# Patient Record
Sex: Female | Born: 1955
Health system: Southern US, Community
[De-identification: ages and names within clinical notes are randomized; demographics above are authoritative.]

## PROBLEM LIST (undated history)

## (undated) DIAGNOSIS — Z91038 Other insect allergy status: Secondary | ICD-10-CM

## (undated) DIAGNOSIS — I7300 Raynaud's syndrome without gangrene: Secondary | ICD-10-CM

## (undated) DIAGNOSIS — Z91030 Bee allergy status: Secondary | ICD-10-CM

## (undated) DIAGNOSIS — C4491 Basal cell carcinoma of skin, unspecified: Secondary | ICD-10-CM

## (undated) DIAGNOSIS — R200 Anesthesia of skin: Secondary | ICD-10-CM

## (undated) DIAGNOSIS — M5030 Other cervical disc degeneration, unspecified cervical region: Secondary | ICD-10-CM

## (undated) DIAGNOSIS — G5601 Carpal tunnel syndrome, right upper limb: Secondary | ICD-10-CM

## (undated) DIAGNOSIS — K219 Gastro-esophageal reflux disease without esophagitis: Secondary | ICD-10-CM

## (undated) DIAGNOSIS — Z780 Asymptomatic menopausal state: Secondary | ICD-10-CM

## (undated) DIAGNOSIS — M1990 Unspecified osteoarthritis, unspecified site: Secondary | ICD-10-CM

## (undated) DIAGNOSIS — T7840XA Allergy, unspecified, initial encounter: Secondary | ICD-10-CM

## (undated) DIAGNOSIS — M5020 Other cervical disc displacement, unspecified cervical region: Secondary | ICD-10-CM

## (undated) HISTORY — DX: Basal cell carcinoma of skin, unspecified: C44.91

## (undated) HISTORY — DX: Other insect allergy status: Z91.038

## (undated) HISTORY — DX: Allergy, unspecified, initial encounter: T78.40XA

## (undated) HISTORY — DX: Carpal tunnel syndrome, right upper limb: G56.01

## (undated) HISTORY — DX: Raynaud's syndrome without gangrene: I73.00

## (undated) HISTORY — DX: Other cervical disc degeneration, unspecified cervical region: M50.30

## (undated) HISTORY — DX: Unspecified osteoarthritis, unspecified site: M19.90

## (undated) HISTORY — DX: Anesthesia of skin: R20.0

## (undated) HISTORY — DX: Gastro-esophageal reflux disease without esophagitis: K21.9

## (undated) HISTORY — DX: Bee allergy status: Z91.030

## (undated) HISTORY — DX: Asymptomatic menopausal state: Z78.0

## (undated) HISTORY — DX: Other cervical disc displacement, unspecified cervical region: M50.20

---

## 1995-12-02 HISTORY — PX: BREAST CYST ASPIRATION: SHX578

## 1999-10-02 ENCOUNTER — Encounter: Payer: Self-pay | Admitting: Obstetrics and Gynecology

## 1999-10-02 ENCOUNTER — Encounter: Admission: RE | Admit: 1999-10-02 | Discharge: 1999-10-02 | Payer: Self-pay | Admitting: Obstetrics and Gynecology

## 2001-02-03 ENCOUNTER — Encounter: Payer: Self-pay | Admitting: Obstetrics and Gynecology

## 2001-02-03 ENCOUNTER — Encounter: Admission: RE | Admit: 2001-02-03 | Discharge: 2001-02-03 | Payer: Self-pay | Admitting: Obstetrics and Gynecology

## 2002-08-10 ENCOUNTER — Encounter: Admission: RE | Admit: 2002-08-10 | Discharge: 2002-08-10 | Payer: Self-pay | Admitting: Obstetrics and Gynecology

## 2002-08-10 ENCOUNTER — Encounter: Payer: Self-pay | Admitting: Obstetrics and Gynecology

## 2003-11-01 ENCOUNTER — Ambulatory Visit (HOSPITAL_COMMUNITY): Admission: RE | Admit: 2003-11-01 | Discharge: 2003-11-01 | Payer: Self-pay | Admitting: Oral Surgery

## 2004-07-26 HISTORY — PX: BIOPSY ENDOMETRIAL: PRO11

## 2004-10-02 ENCOUNTER — Encounter: Admission: RE | Admit: 2004-10-02 | Discharge: 2004-10-02 | Payer: Self-pay | Admitting: Orthopaedic Surgery

## 2005-07-09 ENCOUNTER — Ambulatory Visit (HOSPITAL_COMMUNITY): Admission: RE | Admit: 2005-07-09 | Discharge: 2005-07-09 | Payer: Self-pay | Admitting: Internal Medicine

## 2006-07-26 DIAGNOSIS — C4491 Basal cell carcinoma of skin, unspecified: Secondary | ICD-10-CM

## 2006-07-26 HISTORY — DX: Basal cell carcinoma of skin, unspecified: C44.91

## 2006-12-02 ENCOUNTER — Other Ambulatory Visit: Admission: RE | Admit: 2006-12-02 | Discharge: 2006-12-02 | Payer: Self-pay | Admitting: Internal Medicine

## 2006-12-30 ENCOUNTER — Encounter: Admission: RE | Admit: 2006-12-30 | Discharge: 2006-12-30 | Payer: Self-pay | Admitting: Internal Medicine

## 2008-11-08 ENCOUNTER — Other Ambulatory Visit: Admission: RE | Admit: 2008-11-08 | Discharge: 2008-11-08 | Payer: Self-pay | Admitting: Internal Medicine

## 2008-11-08 ENCOUNTER — Ambulatory Visit: Payer: Self-pay | Admitting: Internal Medicine

## 2009-02-15 ENCOUNTER — Emergency Department (HOSPITAL_COMMUNITY): Admission: EM | Admit: 2009-02-15 | Discharge: 2009-02-15 | Payer: Self-pay | Admitting: Emergency Medicine

## 2009-12-12 ENCOUNTER — Ambulatory Visit: Payer: Self-pay | Admitting: Internal Medicine

## 2010-03-13 ENCOUNTER — Ambulatory Visit: Payer: Self-pay | Admitting: Internal Medicine

## 2010-10-01 ENCOUNTER — Other Ambulatory Visit: Payer: Self-pay | Admitting: Internal Medicine

## 2010-10-01 DIAGNOSIS — Z1231 Encounter for screening mammogram for malignant neoplasm of breast: Secondary | ICD-10-CM

## 2010-10-16 ENCOUNTER — Ambulatory Visit
Admission: RE | Admit: 2010-10-16 | Discharge: 2010-10-16 | Disposition: A | Discharge status: Home / Self Care | Payer: 59 | Source: Ambulatory Visit | Attending: Internal Medicine | Admitting: Internal Medicine

## 2010-10-16 DIAGNOSIS — Z1231 Encounter for screening mammogram for malignant neoplasm of breast: Secondary | ICD-10-CM

## 2010-11-01 LAB — POCT I-STAT, CHEM 8
BUN: 25 mg/dL — ABNORMAL HIGH (ref 6–23)
Creatinine, Ser: 1 mg/dL (ref 0.4–1.2)
Potassium: 3.9 mEq/L (ref 3.5–5.1)
Sodium: 132 mEq/L — ABNORMAL LOW (ref 135–145)
TCO2: 26 mmol/L (ref 0–100)

## 2010-11-01 LAB — GLUCOSE, CAPILLARY: Glucose-Capillary: 92 mg/dL (ref 70–99)

## 2011-01-08 ENCOUNTER — Other Ambulatory Visit: Payer: 59 | Admitting: Internal Medicine

## 2011-01-15 ENCOUNTER — Encounter: Payer: 59 | Admitting: Internal Medicine

## 2011-03-19 ENCOUNTER — Other Ambulatory Visit: Payer: 59 | Admitting: Internal Medicine

## 2011-03-19 DIAGNOSIS — Z0000 Encounter for general adult medical examination without abnormal findings: Secondary | ICD-10-CM

## 2011-03-19 LAB — CBC WITH DIFFERENTIAL/PLATELET
Eosinophils Absolute: 0 10*3/uL (ref 0.0–0.7)
Eosinophils Relative: 1 % (ref 0–5)
Hemoglobin: 13.7 g/dL (ref 12.0–15.0)
Lymphocytes Relative: 28 % (ref 12–46)
Lymphs Abs: 0.7 10*3/uL (ref 0.7–4.0)
MCH: 32.1 pg (ref 26.0–34.0)
MCV: 94.8 fL (ref 78.0–100.0)
Monocytes Relative: 6 % (ref 3–12)
Neutrophils Relative %: 64 % (ref 43–77)
Platelets: 230 10*3/uL (ref 150–400)
RBC: 4.27 MIL/uL (ref 3.87–5.11)
WBC: 2.6 10*3/uL — ABNORMAL LOW (ref 4.0–10.5)

## 2011-03-19 LAB — TSH: TSH: 0.879 u[IU]/mL (ref 0.350–4.500)

## 2011-03-20 LAB — COMPREHENSIVE METABOLIC PANEL
BUN: 16 mg/dL (ref 6–23)
CO2: 27 mEq/L (ref 19–32)
Calcium: 9.3 mg/dL (ref 8.4–10.5)
Chloride: 100 mEq/L (ref 96–112)
Creat: 0.77 mg/dL (ref 0.50–1.10)
Total Bilirubin: 0.4 mg/dL (ref 0.3–1.2)

## 2011-03-20 LAB — LIPID PANEL
HDL: 97 mg/dL (ref >39)
Total CHOL/HDL Ratio: 2.2 Ratio
Triglycerides: 53 mg/dL (ref <150)

## 2011-04-02 ENCOUNTER — Ambulatory Visit (INDEPENDENT_AMBULATORY_CARE_PROVIDER_SITE_OTHER): Payer: 59 | Admitting: Internal Medicine

## 2011-04-02 ENCOUNTER — Encounter: Payer: Self-pay | Admitting: Internal Medicine

## 2011-04-02 ENCOUNTER — Other Ambulatory Visit: Payer: Self-pay | Admitting: Internal Medicine

## 2011-04-02 ENCOUNTER — Other Ambulatory Visit (HOSPITAL_COMMUNITY)
Admission: RE | Admit: 2011-04-02 | Discharge: 2011-04-02 | Disposition: A | Discharge status: Home / Self Care | Payer: 59 | Source: Ambulatory Visit | Attending: Internal Medicine | Admitting: Internal Medicine

## 2011-04-02 VITALS — BP 124/84 | HR 68 | Temp 99.2°F | Ht 64.0 in | Wt 94.5 lb

## 2011-04-02 DIAGNOSIS — Z0000 Encounter for general adult medical examination without abnormal findings: Secondary | ICD-10-CM

## 2011-04-02 DIAGNOSIS — Z124 Encounter for screening for malignant neoplasm of cervix: Secondary | ICD-10-CM

## 2011-04-02 DIAGNOSIS — T7840XA Allergy, unspecified, initial encounter: Secondary | ICD-10-CM

## 2011-04-02 DIAGNOSIS — Z91038 Other insect allergy status: Secondary | ICD-10-CM

## 2011-04-02 DIAGNOSIS — Z23 Encounter for immunization: Secondary | ICD-10-CM

## 2011-04-02 DIAGNOSIS — Z01419 Encounter for gynecological examination (general) (routine) without abnormal findings: Secondary | ICD-10-CM | POA: Insufficient documentation

## 2011-04-02 DIAGNOSIS — IMO0001 Reserved for inherently not codable concepts without codable children: Secondary | ICD-10-CM

## 2011-04-02 DIAGNOSIS — D709 Neutropenia, unspecified: Secondary | ICD-10-CM

## 2011-04-02 DIAGNOSIS — M7918 Myalgia, other site: Secondary | ICD-10-CM

## 2011-04-02 LAB — POCT URINALYSIS DIPSTICK
Bilirubin, UA: NEGATIVE
Glucose, UA: NEGATIVE
Ketones, UA: NEGATIVE
Leukocytes, UA: NEGATIVE
Nitrite, UA: NEGATIVE
pH, UA: 7

## 2011-04-05 ENCOUNTER — Telehealth: Payer: Self-pay | Admitting: *Deleted

## 2011-04-05 MED ORDER — MELOXICAM 15 MG PO TABS: 15.0000 mg | ORAL_TABLET | Freq: Every day | ORAL | Status: DC

## 2011-04-05 NOTE — Telephone Encounter (Signed)
Please call in Mobic 15mg  #30 with 3 refills one tab p.o. Daily with a meal.

## 2011-04-05 NOTE — Telephone Encounter (Signed)
Wants Rx called in for Mobic.  States she spoke with you about this medication when she was here for her physical on Friday.

## 2011-04-12 ENCOUNTER — Telehealth: Payer: Self-pay | Admitting: *Deleted

## 2011-04-12 NOTE — Telephone Encounter (Signed)
Phone call report read and noted. MJB

## 2011-04-12 NOTE — Telephone Encounter (Signed)
Spoke with patient regarding appointment with Dr. Electa Sniff.  This MD no longer sees patients on Fridays and due to patient's schedule this is the only day she can make an OV.  She stated she did not want this particular appointment at this time and would call office when she changes her mind.  She does have an appointment with hematology on Friday September 28th at 1:30.

## 2011-04-20 ENCOUNTER — Encounter: Payer: 59 | Admitting: Oncology

## 2011-04-22 ENCOUNTER — Encounter: Payer: Self-pay | Admitting: Internal Medicine

## 2011-04-22 DIAGNOSIS — M7918 Myalgia, other site: Secondary | ICD-10-CM | POA: Insufficient documentation

## 2011-04-22 DIAGNOSIS — Z91038 Other insect allergy status: Secondary | ICD-10-CM | POA: Insufficient documentation

## 2011-04-22 NOTE — Progress Notes (Signed)
Subjective:    Patient ID: Tammy Shields, female    DOB: Jun 14, 1956, 55 y.o.   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 History of insect sting allergy July 2010. Patient exercises a great deal. She is very thin. Had torn labrum of left shoulder Dec 16, 2004. History of osteopenia. Recent fasting lab work showed a white blood cell count of 2600. Looking back over her blood counts over the past few years she appears to have a mild neutropenia. History of moderately elevated cholesterol in 12-16-09 with total cholesterol of 243 and LDL cholesterol of 121. By August 2011 she had normalized her cholesterol with diet and exercise. Her white blood cell count in 2011-05-24was 2900, in April 2010 it was 3900, in 05-24-2008it was 2898-12-16 and records from OB/GYN showed that in 12/16/05 white blood cell count was 3500. She takes calcium and vitamin D, multivitamin, vitamin C, fish old, be complex and some ibuprofen. 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 History of Raynaud's phenomenon. History of issues with sciatica since 1992/12/16. History of bruxism. Patient admits that sometimes she exercises in excess to control her weight. This dates back to early teen years. Realizes it's a form of control. Had amenorrhea for 2 years in Dec 17, 2003. Last menstrual period was 12-17-2006.  Father died at age 2 with prostate cancer, mother died at age 45 of cardiac arrest. Mother with history of diabetes and hypertension and cancer. One brother with diabetes. One sister in good health.  Patient did take Fosamax for a period of time but discontinued it.    Review of Systems  Constitutional: Negative.   HENT: Negative.   Eyes: Negative.   Respiratory: Negative.   Cardiovascular: Negative.   Gastrointestinal: Negative.   Genitourinary: Negative.   Musculoskeletal: Positive for back pain.       Issues with back pain, has had recent issues with foot pain  Neurological: Negative.     Hematological: Negative.   Psychiatric/Behavioral: Negative.        Objective:   Physical Exam  Vitals reviewed. Constitutional: She is oriented to person, place, and time. No distress.       Thin white female  HENT:  Head: Normocephalic and atraumatic.  Right Ear: External ear normal.  Left Ear: External ear normal.  Mouth/Throat: Oropharynx is clear and moist.  Eyes: Conjunctivae and EOM are normal. Pupils are equal, round, and reactive to light. No scleral icterus.  Neck: Neck supple. No thyromegaly present.  Cardiovascular: Normal rate, regular rhythm and normal heart sounds.   Pulmonary/Chest: Effort normal and breath sounds normal. She has no rales.       Breasts normal female  Abdominal: Soft. Bowel sounds are normal. She exhibits no mass. There is no tenderness.  Musculoskeletal: Normal range of motion. She exhibits no edema.  Lymphadenopathy:    She has no cervical adenopathy.  Neurological: She is alert and oriented to person, place, and time. She has normal reflexes. No cranial nerve deficit.  Skin: Skin is warm and dry. She is not diaphoretic.  Psychiatric: She has a normal mood and affect.          Assessment & Plan:  Long-standing history of neutropenia? Benign absolute neutropenia  Issues with anorexia  Musculoskeletal pain secondary to heavy exercise including back and foot pain  Health maintenance- patient needs screening colonoscopy  Will see hematology consultation regarding neutropenia  Consider referring her to Dr. Roanna Epley regarding  foot pain

## 2011-04-23 ENCOUNTER — Other Ambulatory Visit: Payer: Self-pay | Admitting: Oncology

## 2011-04-23 ENCOUNTER — Encounter (HOSPITAL_BASED_OUTPATIENT_CLINIC_OR_DEPARTMENT_OTHER): Payer: 59 | Admitting: Oncology

## 2011-04-23 DIAGNOSIS — D72819 Decreased white blood cell count, unspecified: Secondary | ICD-10-CM

## 2011-04-23 LAB — COMPREHENSIVE METABOLIC PANEL
AST: 31 U/L (ref 0–37)
Albumin: 4.9 g/dL (ref 3.5–5.2)
Alkaline Phosphatase: 66 U/L (ref 39–117)
Chloride: 101 mEq/L (ref 96–112)
Glucose, Bld: 90 mg/dL (ref 70–99)
Potassium: 4.4 mEq/L (ref 3.5–5.3)
Sodium: 141 mEq/L (ref 135–145)
Total Protein: 7 g/dL (ref 6.0–8.3)

## 2011-04-23 LAB — CBC WITH DIFFERENTIAL/PLATELET
Basophils Absolute: 0 10*3/uL (ref 0.0–0.1)
EOS%: 1.4 % (ref 0.0–7.0)
HGB: 13.3 g/dL (ref 11.6–15.9)
MCH: 33.4 pg (ref 25.1–34.0)
MONO#: 0.2 10*3/uL (ref 0.1–0.9)
NEUT#: 1.7 10*3/uL (ref 1.5–6.5)
RDW: 12.9 % (ref 11.2–14.5)
WBC: 2.9 10*3/uL — ABNORMAL LOW (ref 3.9–10.3)
lymph#: 0.9 10*3/uL (ref 0.9–3.3)

## 2011-07-23 ENCOUNTER — Telehealth: Payer: Self-pay | Admitting: Oncology

## 2011-07-23 NOTE — Telephone Encounter (Signed)
lm on machine for 09/03/11 appt,also mailed    aom appt per pof from 04/23/11

## 2011-09-03 ENCOUNTER — Encounter: Payer: Self-pay | Admitting: *Deleted

## 2011-09-03 ENCOUNTER — Ambulatory Visit (HOSPITAL_BASED_OUTPATIENT_CLINIC_OR_DEPARTMENT_OTHER): Payer: 59 | Admitting: Oncology

## 2011-09-03 ENCOUNTER — Other Ambulatory Visit (HOSPITAL_BASED_OUTPATIENT_CLINIC_OR_DEPARTMENT_OTHER): Payer: 59 | Admitting: Lab

## 2011-09-03 VITALS — BP 108/66 | HR 75 | Temp 98.6°F | Ht 64.0 in | Wt 100.8 lb

## 2011-09-03 DIAGNOSIS — D72819 Decreased white blood cell count, unspecified: Secondary | ICD-10-CM

## 2011-09-03 LAB — CBC WITH DIFFERENTIAL/PLATELET
BASO%: 0.9 % (ref 0.0–2.0)
LYMPH%: 32.6 % (ref 14.0–49.7)
MCHC: 35 g/dL (ref 31.5–36.0)
MCV: 93.6 fL (ref 79.5–101.0)
MONO%: 9.9 % (ref 0.0–14.0)
Platelets: 230 10*3/uL (ref 145–400)
RBC: 4.22 10*6/uL (ref 3.70–5.45)
WBC: 3.3 10*3/uL — ABNORMAL LOW (ref 3.9–10.3)

## 2011-09-03 NOTE — Progress Notes (Signed)
Hematology and Oncology Follow Up Visit  Tammy Shields [id number] 14-Sep-1955 56 y.o. 09/03/2011 9:59 AM    Principle Diagnosis: 56 year old woman with the Isolated leukocytopenia with a normal differential and normal hemoglobin and platelets. Likely of benign etiology. Possibilities include medication vs. Normal variations.    Interim History: Dr. Autumn Messing presents today for a follow up visit. She report no new symptoms since the last visit. She had not had any fevers, had not had any chills, had not had any weight loss.  Had not had really any major changes in her performance status or activity level.  She is still performing most activities of daily living without any hindrance or decline.  She has not had any abdominal pain.  Had not had any recurrent sinopulmonary infections.  Overall performance status and activity level have been unchanged.  Medications: I have reviewed the patient's current medications. Current outpatient prescriptions:b complex vitamins tablet, Take 1 tablet by mouth daily.  , Disp: , Rfl: ;  calcium carbonate (OS-CAL) 600 MG TABS, Take 600 mg by mouth 2 (two) times daily with a meal.  , Disp: , Rfl: ;  Cholecalciferol (VITAMIN D) 1000 UNITS capsule, Take 1,000 Units by mouth daily.  , Disp: , Rfl: ;  fish oil-omega-3 fatty acids 1000 MG capsule, Take 2 g by mouth daily.  , Disp: , Rfl:  ibuprofen (ADVIL,MOTRIN) 200 MG tablet, Take 200 mg by mouth. Hs prn , Disp: , Rfl: ;  multivitamin-iron-minerals-folic acid (CENTRUM) chewable tablet, Chew 1 tablet by mouth daily.  , Disp: , Rfl:   Allergies:  Allergies  Allergen Reactions  . Bee Venom Anaphylaxis    Past Medical History, Surgical history, Social history, and Family History were reviewed and updated.  Review of Systems: Constitutional:  Negative for fever, chills, night sweats, anorexia, weight loss, pain. Cardiovascular: no chest pain or dyspnea on exertion Respiratory: no cough, shortness of breath, or  wheezing Neurological: no TIA or stroke symptoms Dermatological: negative ENT: negative Skin: Negative. Gastrointestinal: no abdominal pain, change in bowel habits, or black or bloody stools Genito-Urinary: negative Hematological and Lymphatic: negative Breast: negative Musculoskeletal: negative Remaining ROS negative. Physical Exam: Blood pressure 108/66, pulse 75, temperature 98.6 F (37 C), temperature source Oral, height 5\' 4"  (1.626 m), weight 100 lb 12.8 oz (45.723 kg). ECOG: 0 General appearance: alert Head: Normocephalic, without obvious abnormality, atraumatic Neck: no adenopathy, no carotid bruit, no JVD, supple, symmetrical, trachea midline and thyroid not enlarged, symmetric, no tenderness/mass/nodules Lymph nodes: Cervical, supraclavicular, and axillary nodes normal. Heart:regular rate and rhythm, S1, S2 normal, no murmur, click, rub or gallop Lung:chest clear, no wheezing, rales, normal symmetric air entry Abdomin: soft, non-tender, without masses or organomegaly EXT:no erythema, induration, or nodules   Lab Results: Lab Results  Component Value Date   WBC 3.3* 09/03/2011   HGB 13.8 09/03/2011   HCT 39.5 09/03/2011   MCV 93.6 09/03/2011   PLT 230 09/03/2011     Chemistry      Component Value Date/Time   NA 141 04/23/2011 1024   K 4.4 04/23/2011 1024   CL 101 04/23/2011 1024   CO2 28 04/23/2011 1024   BUN 23 04/23/2011 1024   CREATININE 0.71 04/23/2011 1024   CREATININE 0.77 03/19/2011 1031      Component Value Date/Time   CALCIUM 9.6 04/23/2011 1024   ALKPHOS 66 04/23/2011 1024   AST 31 04/23/2011 1024   ALT 23 04/23/2011 1024   BILITOT 0.3 04/23/2011 1024  Impression and Plan: This is a pleasant 56 year old woman with Isolated leukocytopenia with a normal differential and normal hemoglobin and platelets. Her WBC is better today than last visit in 03/2011. The differential diagnosis at this time include benign variations, medications, autoimmune and less likely a  blood disorder.  Given the chronicity of this finding as well as the slight improvement in her counts, I favour a benign finding at this time. No further hematology work up is needed. I do recommend anunal cbc which can be done with her PCP. And if any new abnormality arises, they we will revaluate her then.  Follow up will be as needed.    [facility], MD 2/8/20139:59 AM

## 2012-12-01 ENCOUNTER — Other Ambulatory Visit: Payer: 59 | Admitting: Internal Medicine

## 2012-12-01 DIAGNOSIS — Z0000 Encounter for general adult medical examination without abnormal findings: Secondary | ICD-10-CM

## 2012-12-01 LAB — COMPREHENSIVE METABOLIC PANEL
ALT: 22 U/L (ref 0–35)
AST: 28 U/L (ref 0–37)
Calcium: 9.8 mg/dL (ref 8.4–10.5)
Chloride: 97 mEq/L (ref 96–112)
Creat: 0.87 mg/dL (ref 0.50–1.10)
Potassium: 4.5 mEq/L (ref 3.5–5.3)

## 2012-12-01 LAB — CBC WITH DIFFERENTIAL/PLATELET
Basophils Absolute: 0 10*3/uL (ref 0.0–0.1)
Lymphocytes Relative: 26 % (ref 12–46)
Neutro Abs: 3.1 10*3/uL (ref 1.7–7.7)
Platelets: 222 10*3/uL (ref 150–400)
RDW: 13.8 % (ref 11.5–15.5)
WBC: 4.7 10*3/uL (ref 4.0–10.5)

## 2012-12-01 LAB — LIPID PANEL: Total CHOL/HDL Ratio: 2.1 Ratio

## 2012-12-01 LAB — TSH: TSH: 0.65 u[IU]/mL (ref 0.350–4.500)

## 2012-12-02 LAB — VITAMIN D 25 HYDROXY (VIT D DEFICIENCY, FRACTURES): Vit D, 25-Hydroxy: 56 ng/mL (ref 30–89)

## 2012-12-08 ENCOUNTER — Encounter: Payer: Self-pay | Admitting: Internal Medicine

## 2012-12-08 ENCOUNTER — Ambulatory Visit (INDEPENDENT_AMBULATORY_CARE_PROVIDER_SITE_OTHER): Payer: 59 | Admitting: Internal Medicine

## 2012-12-08 ENCOUNTER — Other Ambulatory Visit (HOSPITAL_COMMUNITY)
Admission: RE | Admit: 2012-12-08 | Discharge: 2012-12-08 | Disposition: A | Discharge status: Home / Self Care | Payer: 59 | Source: Ambulatory Visit | Attending: Internal Medicine | Admitting: Internal Medicine

## 2012-12-08 VITALS — BP 116/80 | HR 68 | Temp 98.4°F | Ht 64.5 in | Wt 97.5 lb

## 2012-12-08 DIAGNOSIS — R0789 Other chest pain: Secondary | ICD-10-CM

## 2012-12-08 DIAGNOSIS — Z0000 Encounter for general adult medical examination without abnormal findings: Secondary | ICD-10-CM

## 2012-12-08 DIAGNOSIS — IMO0001 Reserved for inherently not codable concepts without codable children: Secondary | ICD-10-CM

## 2012-12-08 DIAGNOSIS — Z91038 Other insect allergy status: Secondary | ICD-10-CM

## 2012-12-08 DIAGNOSIS — Z01419 Encounter for gynecological examination (general) (routine) without abnormal findings: Secondary | ICD-10-CM | POA: Insufficient documentation

## 2012-12-08 DIAGNOSIS — M7918 Myalgia, other site: Secondary | ICD-10-CM

## 2012-12-08 DIAGNOSIS — Z862 Personal history of diseases of the blood and blood-forming organs and certain disorders involving the immune mechanism: Secondary | ICD-10-CM

## 2012-12-08 LAB — POCT URINALYSIS DIPSTICK
Blood, UA: NEGATIVE
Glucose, UA: NEGATIVE
Nitrite, UA: NEGATIVE
Protein, UA: NEGATIVE
Urobilinogen, UA: NEGATIVE
pH, UA: 6

## 2012-12-08 NOTE — Progress Notes (Signed)
Subjective:    Patient ID: Tammy Shields, female    DOB: 03-21-1956, 57 y.o.   MRN: [id number]  HPI  and 57 year old white female in today for health maintenance and evaluation of medical problems. On a recent trip to the [place] while scuba diving she felt some tightness in her chest wall under the water. This happened a few days in a row. No radiation of pain into her neck or down her left arm. Episodes only lasted about 10 minutes. She attributes it to anxiety. There has been some situational stress. She has allergy to insect stainings and carries an EpiPen. History of musculoskeletal pain. Having some pain with right MCP joint and right worry as. Right wrist pain started just after the scuba diving trip and there was some slight swelling of her wrist. It is getting better.  History of toward labral of left shoulder 2006. History of osteopenia. Took Fosamax for a while but discontinued it. Has not had bone density study in about 2 years so an order was given for her to have one. Has had low white blood cell count in the past which is being followed. White blood cell count in may 2011 was 2900, April 2010 was 3900, may 2008 was 2900. In 2007 white blood cell count was 3500. History of Raynaud's phenomenon. History of sciatica since 1994. History of bruxism. Patient admits that sometimes she exercises and access to control her weight and this dates back to her early teen years. Had amenorrhea for 2 years in 2005. Last menstrual period was in 2008.  Social history: She is married to an Transport planner. 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  Family history: Father died at age 32 with prostate cancer. Mother died at age 66 of cardiac arrest. Mother with history of diabetes, hypertension and cancer. One brother with diabetes. One sister in good health.  Has never had screening colonoscopy.    Review of Systems  Constitutional: Negative.   HENT: Negative.   Eyes: Negative.    Respiratory: Negative.   Cardiovascular: Positive for chest pain.  Gastrointestinal: Negative.   Endocrine: Negative.   Genitourinary: Negative.   Allergic/Immunologic:       History of insect sting allergy  Hematological:       History of neutropenia for many years  Psychiatric/Behavioral: Negative.        Objective:   Physical Exam  Vitals reviewed. Constitutional: She is oriented to person, place, and time. She appears well-developed and well-nourished. No distress.  HENT:  Head: Normocephalic and atraumatic.  Right Ear: External ear normal.  Left Ear: External ear normal.  Mouth/Throat: Oropharynx is clear and moist. No oropharyngeal exudate.  Eyes: Conjunctivae and EOM are normal. Pupils are equal, round, and reactive to light. Right eye exhibits no discharge. Left eye exhibits no discharge. No scleral icterus.  Neck: Normal range of motion. Neck supple. No JVD present. No thyromegaly present.  Cardiovascular: Normal rate, regular rhythm, normal heart sounds and intact distal pulses.   No murmur heard. Pulmonary/Chest: Effort normal and breath sounds normal. No respiratory distress. She has no wheezes. She has no rales. She exhibits no tenderness.  Breasts without masses  Abdominal: Soft. Bowel sounds are normal. She exhibits no distension and no mass. There is no tenderness. There is no rebound and no guarding.  Genitourinary: No vaginal discharge found.  Pap taken. Vaginal atrophy. Bimanual exam is normal. Rectovaginal confirms.  Musculoskeletal: Normal range of motion. She exhibits no  edema.  Lymphadenopathy:    She has no cervical adenopathy.  Neurological: She is alert and oriented to person, place, and time. She has normal reflexes. No cranial nerve deficit. Coordination normal.  Skin: Skin is warm and dry. No rash noted. She is not diaphoretic.  Psychiatric: She has a normal mood and affect. Her behavior is normal. Judgment and thought content normal.           Assessment & Plan:  Chest tightness on recent scuba diving trip-several episodes lasting short periods of time. Also had one similar episode while working on the patient recently. EKG is within normal limits. Patient had no diaphoresis or radiation of pain. Relates this to being anxious.  History of neutropenia which is long-standing over many years. White blood cell count recently 4700 which is the best reading she's had in some time  Elevated HDL cholesterol of 104  Musculoskeletal pain  Insect sting allergy  Probable tendinitis right wrist  Plan: Mentioned colonoscopy and she will consider it. Mammogram and bone density study. Order written for both. Return in one year or as needed.

## 2012-12-08 NOTE — Patient Instructions (Addendum)
Return in one year or as needed. Have mammogram and bone density study. Consider colonoscopy. Refill EpiPen when necessary one year if drugstore calls

## 2013-07-06 ENCOUNTER — Encounter: Payer: Self-pay | Admitting: Internal Medicine

## 2013-07-06 ENCOUNTER — Encounter: Payer: Self-pay | Admitting: Gastroenterology

## 2013-07-06 ENCOUNTER — Ambulatory Visit (INDEPENDENT_AMBULATORY_CARE_PROVIDER_SITE_OTHER): Payer: 59 | Admitting: Internal Medicine

## 2013-07-06 VITALS — BP 124/88 | HR 68 | Temp 98.5°F | Ht 65.0 in | Wt 99.0 lb

## 2013-07-06 DIAGNOSIS — M25559 Pain in unspecified hip: Secondary | ICD-10-CM

## 2013-07-06 DIAGNOSIS — K589 Irritable bowel syndrome without diarrhea: Secondary | ICD-10-CM

## 2013-07-06 DIAGNOSIS — M25551 Pain in right hip: Secondary | ICD-10-CM

## 2013-07-06 DIAGNOSIS — IMO0002 Reserved for concepts with insufficient information to code with codable children: Secondary | ICD-10-CM

## 2013-07-12 ENCOUNTER — Telehealth: Payer: Self-pay | Admitting: *Deleted

## 2013-07-12 NOTE — Telephone Encounter (Signed)
Prior auth # [id number] for mri w/o contrast

## 2013-07-15 ENCOUNTER — Ambulatory Visit
Admission: RE | Admit: 2013-07-15 | Discharge: 2013-07-15 | Disposition: A | Discharge status: Home / Self Care | Payer: 59 | Source: Ambulatory Visit | Attending: Internal Medicine | Admitting: Internal Medicine

## 2013-07-15 DIAGNOSIS — IMO0002 Reserved for concepts with insufficient information to code with codable children: Secondary | ICD-10-CM

## 2013-07-16 ENCOUNTER — Telehealth: Payer: Self-pay | Admitting: Internal Medicine

## 2013-07-16 NOTE — Telephone Encounter (Signed)
Dr. Beryle Quant note advised no significant disc or nerve root impingement.  Offered to refer patient to Orthopedic doc.  Patient will take the referral.   Appointment made for Dr. Norlene Campbell at [facility] @ [phone] for 07/27/13 @ 0930.  [address], [address].  Faxed notes/labs/imaging to [phone]. Patient notified and confirmed.

## 2013-07-18 ENCOUNTER — Encounter: Payer: Self-pay | Admitting: Gastroenterology

## 2013-07-18 ENCOUNTER — Ambulatory Visit (INDEPENDENT_AMBULATORY_CARE_PROVIDER_SITE_OTHER): Payer: 59 | Admitting: Gastroenterology

## 2013-07-18 VITALS — BP 110/70 | HR 80 | Ht 64.5 in | Wt 95.8 lb

## 2013-07-18 DIAGNOSIS — R198 Other specified symptoms and signs involving the digestive system and abdomen: Secondary | ICD-10-CM

## 2013-07-18 DIAGNOSIS — Z1211 Encounter for screening for malignant neoplasm of colon: Secondary | ICD-10-CM

## 2013-07-18 MED ORDER — PEG-KCL-NACL-NASULF-NA ASC-C 100 G PO SOLR: 1.0000 | Freq: Once | ORAL | Status: DC

## 2013-07-18 NOTE — Progress Notes (Signed)
    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 She relates frequent problems with hip pain bilaterally exacerbated by movement and exercise. She has noted problems with mild constipation which she attributes to calcium supplements. She takes magnesium oxide to counter the constipation symptoms which frequently leads to looser stools. These symptoms have been present for several years. Occasionally she notes a small hemorrhoid and straining with a bowel movement. She has had radiation of her bilateral hip pain for her sacral and rectal area. She has occasionally noted thinner stools. She has not previously undergone colonoscopy. Denies weight loss, abdominal pain, melena, hematochezia, nausea, vomiting, dysphagia, reflux symptoms, chest pain.  Review of Systems: Pertinent positive and negative review of systems were noted in the above HPI section. All other review of systems were otherwise negative.  Current Medications, Allergies, Past Medical History, Past Surgical History, Family History and Social History were reviewed in [employer] record.  Physical Exam: General: Well developed, thin, BMI=16, no acute distress Head: Normocephalic and atraumatic Eyes:  sclerae anicteric, EOMI Ears: Normal auditory acuity Mouth: No deformity or lesions Neck: Supple, no masses or thyromegaly Lungs: Clear throughout to auscultation Heart: Regular rate and rhythm; no murmurs, rubs or bruits Abdomen: Soft, non tender and non distended. No masses, hepatosplenomegaly or hernias noted. Normal Bowel sounds Rectal: Deferred to colonoscopy Musculoskeletal: Symmetrical with no gross deformities  Skin: No lesions on visible extremities Pulses:  Normal pulses noted Extremities: No clubbing, cyanosis, edema or deformities noted Neurological: Alert oriented x 4, grossly nonfocal Cervical Nodes:  No significant cervical adenopathy Inguinal Nodes: No significant inguinal  adenopathy Psychological:  Alert and cooperative. Normal mood and affect  Assessment and Recommendations:  1. Chronic mild constipation with frequent diarrhea. Occasional variation in stool caliber and occasional hemorrhoids symptoms. Schedule colonoscopy. The risks, benefits, and alternatives to colonoscopy with possible biopsy and possible polypectomy were discussed with the patient and they consent to proceed.

## 2013-07-18 NOTE — Patient Instructions (Signed)
You have been scheduled for a colonoscopy with propofol. Please follow written instructions given to you at your visit today.  Please pick up your prep kit at the pharmacy within the next 1-3 days. If you use inhalers (even only as needed), please bring them with you on the day of your procedure. Your physician has requested that you go to www.startemmi.com and enter the access code given to you at your visit today. This web site gives a general overview about your procedure. However, you should still follow specific instructions given to you by our office regarding your preparation for the procedure.  Thank you for choosing me and [facility].  Malcolm T. Stark, Jr., MD., FACG  

## 2013-07-23 ENCOUNTER — Encounter: Payer: Self-pay | Admitting: Gastroenterology

## 2013-09-07 ENCOUNTER — Ambulatory Visit (AMBULATORY_SURGERY_CENTER): Payer: 59 | Admitting: Gastroenterology

## 2013-09-07 ENCOUNTER — Encounter: Payer: Self-pay | Admitting: Gastroenterology

## 2013-09-07 VITALS — BP 113/71 | HR 50 | Temp 97.6°F | Resp 14 | Ht 64.0 in | Wt 95.0 lb

## 2013-09-07 DIAGNOSIS — D126 Benign neoplasm of colon, unspecified: Secondary | ICD-10-CM

## 2013-09-07 DIAGNOSIS — R198 Other specified symptoms and signs involving the digestive system and abdomen: Secondary | ICD-10-CM

## 2013-09-07 MED ORDER — SODIUM CHLORIDE 0.9 % IV SOLN: 500.0000 mL | INTRAVENOUS | Status: DC

## 2013-09-07 NOTE — Progress Notes (Signed)
Called to room to assist during endoscopic procedure.  Patient ID and intended procedure confirmed with present staff. Received instructions for my participation in the procedure from the performing physician.  

## 2013-09-07 NOTE — Op Note (Signed)
[facility]  [address] [place], [id number]   COLONOSCOPY PROCEDURE REPORT  PATIENT: Tammy Shields, Tammy Shields  MR#: [id number] BIRTHDATE: 29-Oct-1955 , 27  yrs. old GENDER: Female ENDOSCOPIST: Ladene Artist, MD, [facility] REFERRED FW:YOVZ Parke Simmers, M.D. PROCEDURE DATE:  09/07/2013 PROCEDURE:   Colonoscopy with biopsy First Screening Colonoscopy - Avg.  risk and is 50 yrs.  old or older - No.  Prior Negative Screening - Now for repeat screening. N/A  History of Adenoma - Now for follow-up colonoscopy & has been > or = to 3 yrs.  N/A  Polyps Removed Today? Yes. ASA CLASS:   Class II INDICATIONS:Change in bowel habits. MEDICATIONS: MAC sedation, administered by CRNA and propofol (Diprivan) 280mg  IV DESCRIPTION OF PROCEDURE:   After the risks benefits and alternatives of the procedure were thoroughly explained, informed consent was obtained.  A digital rectal exam revealed no abnormalities of the rectum.   The LB [id number]  endoscope was introduced through the anus and advanced to the cecum, which was identified by both the appendix and ileocecal valve. No adverse events experienced.   The quality of the prep was excellent, using MoviPrep  The instrument was then slowly withdrawn as the colon was fully examined.  COLON FINDINGS: A sessile polyp measuring 4 mm in size was found in the sigmoid colon.  A polypectomy was performed with cold forceps. The resection was complete and the polyp tissue was completely retrieved.   A sessile polyp measuring 5 mm in size was found in the rectum.  A polypectomy was performed with cold forceps.  The resection was complete and the polyp tissue was completely retrieved.   The colon was otherwise normal.  There was no diverticulosis, inflammation, polyps or cancers unless previously stated.  Retroflexed views revealed moderate internal hemorrhoids. The time to cecum=7 minutes 20 seconds.  Withdrawal time=13 minutes 42 seconds.  The  scope was withdrawn and the procedure completed.  COMPLICATIONS: There were no complications.  ENDOSCOPIC IMPRESSION: 1.   Sessile polyp measuring 4 mm in the sigmoid colon; polypectomy performed with cold forceps 2.   Sessile polyp measuring 5 mm in the rectum; polypectomy performed with cold forceps 3.   Moderate internal hemorrhoids  RECOMMENDATIONS: 1.  Await pathology results 2.  Repeat colonoscopy in 5 years if polyp(s) adenomatous; otherwise 10 years  eSigned:  Ladene Artist, MD, [facility] 09/07/2013 4:05 PM

## 2013-09-07 NOTE — Patient Instructions (Signed)
YOU HAD AN ENDOSCOPIC PROCEDURE TODAY AT THE [facility]: Refer to the procedure report that was given to you for any specific questions about what was found during the examination.  If the procedure report does not answer your questions, please call your gastroenterologist to clarify.  If you requested that your care partner not be given the details of your procedure findings, then the procedure report has been included in a sealed envelope for you to review at your convenience later.  YOU SHOULD EXPECT: Some feelings of bloating in the abdomen. Passage of more gas than usual.  Walking can help get rid of the air that was put into your GI tract during the procedure and reduce the bloating. If you had a lower endoscopy (such as a colonoscopy or flexible sigmoidoscopy) you may notice spotting of blood in your stool or on the toilet paper. If you underwent a bowel prep for your procedure, then you may not have a normal bowel movement for a few days.  DIET: Your first meal following the procedure should be a light meal and then it is ok to progress to your normal diet.  A half-sandwich or bowl of soup is an example of a good first meal.  Heavy or fried foods are harder to digest and may make you feel nauseous or bloated.  Likewise meals heavy in dairy and vegetables can cause extra gas to form and this can also increase the bloating.  Drink plenty of fluids but you should avoid alcoholic beverages for 24 hours.  ACTIVITY: Your care partner should take you home directly after the procedure.  You should plan to take it easy, moving slowly for the rest of the day.  You can resume normal activity the day after the procedure however you should NOT DRIVE or use heavy machinery for 24 hours (because of the sedation medicines used during the test).    SYMPTOMS TO REPORT IMMEDIATELY: A gastroenterologist can be reached at any hour.  During normal business hours, 8:30 AM to 5:00 PM Monday through Friday,  call [phone].  After hours and on weekends, please call the GI answering service at [phone] who will take a message and have the physician on call contact you.   Following lower endoscopy (colonoscopy or flexible sigmoidoscopy):  Excessive amounts of blood in the stool  Significant tenderness or worsening of abdominal pains  Swelling of the abdomen that is new, acute  Fever of 100F or higher  FOLLOW UP: If any biopsies were taken you will be contacted by phone or by letter within the next 1-3 weeks.  Call your gastroenterologist if you have not heard about the biopsies in 3 weeks.  Our staff will call the home number listed on your records the next business day following your procedure to check on you and address any questions or concerns that you may have at that time regarding the information given to you following your procedure. This is a courtesy call and so if there is no answer at the home number and we have not heard from you through the emergency physician on call, we will assume that you have returned to your regular daily activities without incident.  SIGNATURES/CONFIDENTIALITY: You and/or your care partner have signed paperwork which will be entered into your electronic medical record.  These signatures attest to the fact that that the information above on your After Visit Summary has been reviewed and is understood.  Full responsibility of the confidentiality of this   discharge information lies with you and/or your care-partner.  Polyps, hemorrhoids-handouts given  Repeat colonoscopy will be determined by pathology.

## 2013-09-10 ENCOUNTER — Telehealth: Payer: Self-pay | Admitting: *Deleted

## 2013-09-10 NOTE — Telephone Encounter (Signed)
Message left

## 2013-09-13 ENCOUNTER — Encounter: Payer: Self-pay | Admitting: Gastroenterology

## 2013-12-29 ENCOUNTER — Encounter: Payer: Self-pay | Admitting: Internal Medicine

## 2013-12-29 NOTE — Patient Instructions (Addendum)
Arrange for colonoscopy. MRI of the LS spine. Decrease amount of exercise for few weeks. Continue Motrin for pain

## 2013-12-29 NOTE — Progress Notes (Signed)
   Subjective:    Patient ID: Tammy Shields, female    DOB: [birth year], 58 y.o.   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 Has not had colonoscopy. Has been having some pain in right hip into leg and back. Does a lot of exercising. Has had some discomfort in rectal area as well. History of sciatica around 1994. History of low white blood cell count which is being followed regularly. History of Raynaud's phenomenon. No nausea, vomiting, appetite changes. No melena or bright red blood per rectum. No weakness in lower extremities.    Review of Systems     Objective:   Physical Exam  abdominal exam: Bowel sounds are active. No hepatosplenomegaly masses or tenderness. Rectal exam no mass appreciated. Straight leg raising is negative at 90. Deep tendon reflexes are 1-2+ and symmetrical in muscle strength appears to be normal. No significant pain with internal and external rotation of right hip.        Assessment & Plan:  Right lower extremity radiculopathy  Hip pain  Possible irritable bowel syndrome  Plan: Arrange GI consultation and colonoscopy. MRI of the LS-spine. Cut back on exercising for several weeks.  25 minutes spent with patient

## 2015-01-01 ENCOUNTER — Other Ambulatory Visit: Payer: Self-pay | Admitting: Internal Medicine

## 2015-01-01 DIAGNOSIS — Z1231 Encounter for screening mammogram for malignant neoplasm of breast: Secondary | ICD-10-CM

## 2015-01-01 DIAGNOSIS — M810 Age-related osteoporosis without current pathological fracture: Secondary | ICD-10-CM

## 2015-01-03 ENCOUNTER — Other Ambulatory Visit: Payer: 59 | Admitting: Internal Medicine

## 2015-01-03 DIAGNOSIS — Z13220 Encounter for screening for lipoid disorders: Secondary | ICD-10-CM

## 2015-01-03 DIAGNOSIS — Z0000 Encounter for general adult medical examination without abnormal findings: Secondary | ICD-10-CM

## 2015-01-03 DIAGNOSIS — Z130 Encounter for screening for diseases of the blood and blood-forming organs and certain disorders involving the immune mechanism: Secondary | ICD-10-CM

## 2015-01-03 DIAGNOSIS — Z1329 Encounter for screening for other suspected endocrine disorder: Secondary | ICD-10-CM

## 2015-01-03 DIAGNOSIS — Z1321 Encounter for screening for nutritional disorder: Secondary | ICD-10-CM

## 2015-01-03 DIAGNOSIS — Z131 Encounter for screening for diabetes mellitus: Secondary | ICD-10-CM

## 2015-01-03 LAB — CBC WITH DIFFERENTIAL/PLATELET
BASOS ABS: 0.1 10*3/uL (ref 0.0–0.1)
Basophils Relative: 2 % — ABNORMAL HIGH (ref 0–1)
Eosinophils Absolute: 0.1 10*3/uL (ref 0.0–0.7)
Eosinophils Relative: 2 % (ref 0–5)
HCT: 41.8 % (ref 36.0–46.0)
Hemoglobin: 14.1 g/dL (ref 12.0–15.0)
LYMPHS ABS: 0.9 10*3/uL (ref 0.7–4.0)
Lymphocytes Relative: 33 % (ref 12–46)
MCH: 31.8 pg (ref 26.0–34.0)
MCHC: 33.7 g/dL (ref 30.0–36.0)
MCV: 94.1 fL (ref 78.0–100.0)
MPV: 10.2 fL (ref 8.6–12.4)
Monocytes Absolute: 0.2 10*3/uL (ref 0.1–1.0)
Monocytes Relative: 7 % (ref 3–12)
Neutro Abs: 1.6 10*3/uL — ABNORMAL LOW (ref 1.7–7.7)
Neutrophils Relative %: 56 % (ref 43–77)
PLATELETS: 212 10*3/uL (ref 150–400)
RBC: 4.44 MIL/uL (ref 3.87–5.11)
RDW: 13.5 % (ref 11.5–15.5)
WBC: 2.8 10*3/uL — AB (ref 4.0–10.5)

## 2015-01-03 LAB — LIPID PANEL
Cholesterol: 232 mg/dL — ABNORMAL HIGH (ref 0–200)
HDL: 100 mg/dL (ref >=46)
LDL Cholesterol: 117 mg/dL — ABNORMAL HIGH (ref 0–99)
Total CHOL/HDL Ratio: 2.3 Ratio
Triglycerides: 74 mg/dL (ref <150)
VLDL: 15 mg/dL (ref 0–40)

## 2015-01-03 LAB — COMPLETE METABOLIC PANEL WITH GFR
ALBUMIN: 4.5 g/dL (ref 3.5–5.2)
ALK PHOS: 64 U/L (ref 39–117)
ALT: 25 U/L (ref 0–35)
AST: 32 U/L (ref 0–37)
BUN: 27 mg/dL — ABNORMAL HIGH (ref 6–23)
CHLORIDE: 102 meq/L (ref 96–112)
CO2: 27 mEq/L (ref 19–32)
CREATININE: 0.79 mg/dL (ref 0.50–1.10)
Calcium: 9.5 mg/dL (ref 8.4–10.5)
GFR, Est Non African American: 81 mL/min
GLUCOSE: 95 mg/dL (ref 70–99)
POTASSIUM: 5 meq/L (ref 3.5–5.3)
SODIUM: 143 meq/L (ref 135–145)
Total Bilirubin: 0.3 mg/dL (ref 0.2–1.2)
Total Protein: 6.6 g/dL (ref 6.0–8.3)

## 2015-01-03 LAB — TSH: TSH: 0.788 u[IU]/mL (ref 0.350–4.500)

## 2015-01-03 NOTE — Addendum Note (Signed)
Addended by: Leota Jacobsen on: 01/03/2015 09:50 AM   Modules accepted: Orders

## 2015-01-04 LAB — VITAMIN D 25 HYDROXY (VIT D DEFICIENCY, FRACTURES): Vit D, 25-Hydroxy: 42 ng/mL (ref 30–100)

## 2015-01-04 LAB — HEMOGLOBIN A1C
Hgb A1c MFr Bld: 5.7 % — ABNORMAL HIGH (ref <5.7)
Mean Plasma Glucose: 117 mg/dL — ABNORMAL HIGH (ref <117)

## 2015-01-10 ENCOUNTER — Encounter: Payer: Self-pay | Admitting: Internal Medicine

## 2015-01-10 ENCOUNTER — Ambulatory Visit
Admission: RE | Admit: 2015-01-10 | Discharge: 2015-01-10 | Disposition: A | Discharge status: Home / Self Care | Payer: 59 | Source: Ambulatory Visit | Attending: Internal Medicine | Admitting: Internal Medicine

## 2015-01-10 DIAGNOSIS — Z1231 Encounter for screening mammogram for malignant neoplasm of breast: Secondary | ICD-10-CM

## 2015-01-10 DIAGNOSIS — M810 Age-related osteoporosis without current pathological fracture: Secondary | ICD-10-CM

## 2015-02-07 ENCOUNTER — Ambulatory Visit (INDEPENDENT_AMBULATORY_CARE_PROVIDER_SITE_OTHER): Payer: 59 | Admitting: Internal Medicine

## 2015-02-07 ENCOUNTER — Encounter: Payer: Self-pay | Admitting: Internal Medicine

## 2015-02-07 VITALS — BP 100/66 | HR 56 | Temp 98.1°F | Ht 64.0 in | Wt 96.0 lb

## 2015-02-07 DIAGNOSIS — Z91038 Other insect allergy status: Secondary | ICD-10-CM | POA: Diagnosis not present

## 2015-02-07 DIAGNOSIS — Z862 Personal history of diseases of the blood and blood-forming organs and certain disorders involving the immune mechanism: Secondary | ICD-10-CM | POA: Diagnosis not present

## 2015-02-07 DIAGNOSIS — R0989 Other specified symptoms and signs involving the circulatory and respiratory systems: Secondary | ICD-10-CM

## 2015-02-07 DIAGNOSIS — Z0000 Encounter for general adult medical examination without abnormal findings: Secondary | ICD-10-CM | POA: Diagnosis not present

## 2015-02-07 DIAGNOSIS — E7889 Other lipoprotein metabolism disorders: Secondary | ICD-10-CM

## 2015-02-07 DIAGNOSIS — M7918 Myalgia, other site: Secondary | ICD-10-CM

## 2015-02-07 DIAGNOSIS — M791 Myalgia: Secondary | ICD-10-CM | POA: Diagnosis not present

## 2015-02-07 DIAGNOSIS — K635 Polyp of colon: Secondary | ICD-10-CM | POA: Diagnosis not present

## 2015-02-07 LAB — POCT URINALYSIS DIPSTICK
BILIRUBIN UA: NEGATIVE
Blood, UA: NEGATIVE
GLUCOSE UA: NEGATIVE
KETONES UA: NEGATIVE
Leukocytes, UA: NEGATIVE
Nitrite, UA: NEGATIVE
PH UA: 7.5
Protein, UA: NEGATIVE
Spec Grav, UA: <=1.005
Urobilinogen, UA: NEGATIVE

## 2015-04-05 DIAGNOSIS — T63481A Toxic effect of venom of other arthropod, accidental (unintentional), initial encounter: Secondary | ICD-10-CM | POA: Insufficient documentation

## 2015-04-05 DIAGNOSIS — T782XXA Anaphylactic shock, unspecified, initial encounter: Secondary | ICD-10-CM

## 2015-04-23 ENCOUNTER — Encounter: Payer: Self-pay | Admitting: Internal Medicine

## 2015-04-23 NOTE — Progress Notes (Signed)
   Subjective:    Patient ID: Tammy Shields, female    DOB: 09-05-1955, 59 y.o.   MRN: [id number]  HPI 59 year old white female in today for health maintenance exam and evaluation of medical issues. She'll and weighs 96 pounds. She exercises frequently. History of allergy 1016's and carries an EpiPen. History of musculoskeletal pain. History of labral tear left shoulder 2006. Osteopenia. She took Fosamax for a while but discontinued it. History of low white blood cell count in the past which is followed. White blood cell count in May 2011 was 2900, April 2010 was 3900, May 2008 was 2900, in 2007 it was 3500. History of Raynaud's phenomenon. History of sciatica. History of bruxism. History of constipation. Had amenorrhea for 2 years in 2005. Last menstrual period was in 2008.  Social history: She is married to a retired Chief Financial Officer. 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  Family history: Father died at age 85 with prostate cancer. Mother died at age 50 of cardiac arrest. Mother with history of diabetes, hypertension, cancer. One brother with diabetes. One sister in good health.    Review of Systems  Constitutional: Negative.   All other systems reviewed and are negative.      Objective:   Physical Exam  Constitutional: She is oriented to person, place, and time. She appears well-developed and well-nourished. No distress.  HENT:  Head: Normocephalic and atraumatic.  Right Ear: External ear normal.  Left Ear: External ear normal.  Mouth/Throat: Oropharynx is clear and moist. No oropharyngeal exudate.  Eyes: Conjunctivae and EOM are normal. Pupils are equal, round, and reactive to light. Right eye exhibits no discharge. Left eye exhibits no discharge. No scleral icterus.  Neck: Neck supple. No JVD present. No thyromegaly present.  Cardiovascular: Normal rate, regular rhythm and normal heart sounds.   No murmur heard. Pulmonary/Chest: Effort normal and breath sounds normal.  No respiratory distress. She has no wheezes. She has no rales.  Breasts normal female without masses  Abdominal: Soft. Bowel sounds are normal. She exhibits no distension and no mass. There is no rebound and no guarding.  Genitourinary:  Pap taken 2014. Bimanual normal.  Musculoskeletal: She exhibits no edema.  Lymphadenopathy:    She has no cervical adenopathy.  Neurological: She is alert and oriented to person, place, and time. She has normal reflexes. No cranial nerve deficit. Coordination normal.  Skin: Skin is warm and dry. No rash noted. She is not diaphoretic.  Psychiatric: She has a normal mood and affect. Her behavior is normal. Judgment and thought content normal.  Vitals reviewed.         Assessment & Plan:  Normal health maintenance exam  History of musculoskeletal pain  History of elevated HDL cholesterol-LDL cholesterol 117  Insect sting allergy allergy  History of neutropenia which is followed-to have repeat white blood cell count in October with peripheral view of smear.  Hemoglobin A 1C at 5.7%-watch diet  History of hyperplastic colon polyp on colonoscopy 2015  Plan: Return in October to follow-up on neutropenia.

## 2015-04-23 NOTE — Patient Instructions (Signed)
Patient will return in October for follow-up of low white blood cell count with peripheral view of smear to be done

## 2015-05-02 ENCOUNTER — Other Ambulatory Visit: Payer: 59 | Admitting: Internal Medicine

## 2015-05-02 DIAGNOSIS — D709 Neutropenia, unspecified: Secondary | ICD-10-CM

## 2015-05-02 LAB — CBC WITH DIFFERENTIAL/PLATELET
BASOS PCT: 1 % (ref 0–1)
Basophils Absolute: 0 10*3/uL (ref 0.0–0.1)
Eosinophils Absolute: 0.1 10*3/uL (ref 0.0–0.7)
Eosinophils Relative: 2 % (ref 0–5)
HEMATOCRIT: 38.4 % (ref 36.0–46.0)
HEMOGLOBIN: 13.5 g/dL (ref 12.0–15.0)
LYMPHS ABS: 1 10*3/uL (ref 0.7–4.0)
LYMPHS PCT: 35 % (ref 12–46)
MCH: 32.3 pg (ref 26.0–34.0)
MCHC: 35.2 g/dL (ref 30.0–36.0)
MCV: 91.9 fL (ref 78.0–100.0)
MONO ABS: 0.3 10*3/uL (ref 0.1–1.0)
MONOS PCT: 9 % (ref 3–12)
MPV: 10.3 fL (ref 8.6–12.4)
NEUTROS ABS: 1.5 10*3/uL — AB (ref 1.7–7.7)
NEUTROS PCT: 53 % (ref 43–77)
Platelets: 215 10*3/uL (ref 150–400)
RBC: 4.18 MIL/uL (ref 3.87–5.11)
RDW: 13.6 % (ref 11.5–15.5)
WBC: 2.9 10*3/uL — ABNORMAL LOW (ref 4.0–10.5)

## 2015-05-02 NOTE — Addendum Note (Signed)
Addended by: Leota Jacobsen on: 05/02/2015 09:21 AM   Modules accepted: Orders

## 2015-05-06 LAB — PATHOLOGIST SMEAR REVIEW

## 2015-05-07 ENCOUNTER — Telehealth: Payer: Self-pay | Admitting: *Deleted

## 2015-05-07 NOTE — Telephone Encounter (Signed)
Left message for Tammy Shields at [facility] to call me back to schedule patient for an appt. Patient needs a Friday appt and can be contacted on her cell phone # [phone]

## 2015-05-09 ENCOUNTER — Ambulatory Visit: Payer: 59 | Admitting: Internal Medicine

## 2015-05-13 ENCOUNTER — Ambulatory Visit (INDEPENDENT_AMBULATORY_CARE_PROVIDER_SITE_OTHER): Payer: 59 | Admitting: *Deleted

## 2015-05-13 DIAGNOSIS — T63441D Toxic effect of venom of bees, accidental (unintentional), subsequent encounter: Secondary | ICD-10-CM

## 2015-05-16 ENCOUNTER — Ambulatory Visit: Payer: 59 | Admitting: Internal Medicine

## 2015-05-27 ENCOUNTER — Telehealth: Payer: Self-pay

## 2015-05-27 NOTE — Telephone Encounter (Signed)
Patient called requesting copy of recent cbc results be mailed to her.  Confirmed address and printed labs to be mailed.  Patient aware.

## 2015-06-24 ENCOUNTER — Ambulatory Visit (INDEPENDENT_AMBULATORY_CARE_PROVIDER_SITE_OTHER): Payer: 59

## 2015-06-24 DIAGNOSIS — T63441D Toxic effect of venom of bees, accidental (unintentional), subsequent encounter: Secondary | ICD-10-CM

## 2015-07-29 ENCOUNTER — Ambulatory Visit (INDEPENDENT_AMBULATORY_CARE_PROVIDER_SITE_OTHER): Payer: 59

## 2015-07-29 DIAGNOSIS — T63441D Toxic effect of venom of bees, accidental (unintentional), subsequent encounter: Secondary | ICD-10-CM

## 2015-08-05 ENCOUNTER — Ambulatory Visit (INDEPENDENT_AMBULATORY_CARE_PROVIDER_SITE_OTHER): Payer: 59 | Admitting: Allergy and Immunology

## 2015-08-05 ENCOUNTER — Encounter: Payer: Self-pay | Admitting: Allergy and Immunology

## 2015-08-05 VITALS — BP 128/80 | HR 60 | Resp 18 | Ht 63.39 in | Wt 96.6 lb

## 2015-08-05 DIAGNOSIS — T63481D Toxic effect of venom of other arthropod, accidental (unintentional), subsequent encounter: Secondary | ICD-10-CM

## 2015-08-05 DIAGNOSIS — Z91038 Other insect allergy status: Secondary | ICD-10-CM

## 2015-08-05 DIAGNOSIS — T782XXD Anaphylactic shock, unspecified, subsequent encounter: Secondary | ICD-10-CM

## 2015-08-05 MED ORDER — EPINEPHRINE 0.3 MG/0.3ML IJ SOAJ: INTRAMUSCULAR | Status: DC

## 2015-08-05 NOTE — Patient Instructions (Signed)
  1.  Continue immunotherapy (and EpiPen) ° °2.  Return to clinic in 1 year or earlier if problem °

## 2015-08-05 NOTE — Progress Notes (Signed)
[facility]  Follow-up Note  Referring Provider: Elby Showers, MD Primary Provider: Elby Showers, MD Date of Office Visit: 08/05/2015  Subjective:   Tammy Shields is a 60 y.o. female who returns to the [facility] in re-evaluation of the following:  HPI Comments:  Tammy Shields returns to this clinic on 08/05/2015 in reevaluation of her Hymenoptera venom hypersensitivity state treated with immunotherapy. Presently she's using immunotherapy about every 6 weeks without any incident. She is not had a field staining. She does have an EpiPen.   Current Outpatient Prescriptions on File Prior to Visit  Medication Sig Dispense Refill  . calcium carbonate (OS-CAL) 600 MG TABS Take 600 mg by mouth 2 (two) times daily with a meal.      . Calcium-Phosphorus-Vitamin D (CITRACAL CALCIUM GUMMIES) [id number] MG-MG-UNIT CHEW Chew 1 Units by mouth daily.    . Cholecalciferol (VITAMIN D) 1000 UNITS capsule Take 2,000 Units by mouth daily.     Marland Kitchen ibuprofen (ADVIL,MOTRIN) 200 MG tablet Take 200 mg by mouth. Hs prn     . magnesium oxide (MAG-OX) 400 MG tablet Take 400 mg by mouth daily.    . Multiple Vitamins-Minerals (CENTRUM SILVER ADULT 50+) TABS Take 1 tablet by mouth daily.    . vitamin B-12 (CYANOCOBALAMIN) 100 MCG tablet Take 150 mcg by mouth daily.    . vitamin C (ASCORBIC ACID) 500 MG tablet Take 500 mg by mouth daily.     No current facility-administered medications on file prior to visit.    Meds ordered this encounter  Medications  . EPINEPHrine 0.3 mg/0.3 mL IJ SOAJ injection    Sig: Use as directed for life-threatening allergic reaction.    Dispense:  2 Device    Refill:  3    Patient has EpiPen coupon.. If EpiPen is not covered, can use generic as substitute.  Thank you- [place]    Past Medical History  Diagnosis Date  . Osteoporosis   . Postmenopausal   . Raynaud's disease     History reviewed. No pertinent  past surgical history.  Allergies  Allergen Reactions  . Bee Venom Anaphylaxis    MIXED VESPID, HONEY BEE    Review of systems negative except as noted in HPI / PMHx or noted below:  Review of Systems  Constitutional: Negative.   HENT: Negative.   Eyes: Negative.   Respiratory: Negative.   Cardiovascular: Negative.   Gastrointestinal: Negative.   Genitourinary: Negative.   Musculoskeletal: Negative.        Untreated osteoporosis  Skin: Negative.   Neurological: Negative.   Endo/Heme/Allergies: Negative.   Psychiatric/Behavioral: Negative.      Objective:   Filed Vitals:   08/05/15 1751  BP: 128/80  Pulse: 60  Resp: 18   Height: 5' 3.39" (161 cm)  Weight: 96 lb 9 oz (43.8 kg)   Physical Exam  Constitutional: She is well-developed, well-nourished, and in no distress. No distress.  HENT:  Head: Normocephalic.  Right Ear: Tympanic membrane, external ear and ear canal normal.  Left Ear: Tympanic membrane, external ear and ear canal normal.  Nose: Nose normal. No mucosal edema or rhinorrhea.  Mouth/Throat: Uvula is midline, oropharynx is clear and moist and mucous membranes are normal. No oropharyngeal exudate.  Eyes: Conjunctivae are normal.  Neck: Trachea normal. No tracheal tenderness present. No tracheal deviation present. No thyromegaly present.  Cardiovascular: Normal rate, regular rhythm, S1 normal, S2 normal and normal heart sounds.  No murmur heard. Pulmonary/Chest: Breath sounds normal. No stridor. No respiratory distress. She has no wheezes. She has no rales.  Musculoskeletal: She exhibits no edema.  Lymphadenopathy:       Head (right side): No tonsillar adenopathy present.       Head (left side): No tonsillar adenopathy present.    She has no cervical adenopathy.    She has no axillary adenopathy.  Neurological: She is alert. Gait normal.  Skin: No rash noted. She is not diaphoretic. No erythema. Nails show no clubbing.  Psychiatric: Mood and affect  normal.    Diagnostics: None  Assessment and Plan:   1. Allergic to insect stings   2. Anaphylaxis due to hymenoptera venom, accidental or unintentional, subsequent encounter      1. Continue immunotherapy and EpiPen  2. Return to clinic in 1 year or earlier if problem  We will continue to have Tammy Shields utilize immunotherapy directed against Hymenoptera venom. She can go out to every 8 weeks at this point in time. Given her extent of initial reaction she does have the option of getting re-skin tested to see if her skin tests go to negative as a result of her immunotherapy and then discontinuing her immunotherapy or she has the option of continuing on immunotherapy every 8 weeks indefinitely. She's presently considering these options.  Tammy Katz, MD [place]

## 2015-09-23 ENCOUNTER — Ambulatory Visit (INDEPENDENT_AMBULATORY_CARE_PROVIDER_SITE_OTHER): Payer: 59

## 2015-09-23 DIAGNOSIS — T63441D Toxic effect of venom of bees, accidental (unintentional), subsequent encounter: Secondary | ICD-10-CM

## 2015-11-13 ENCOUNTER — Ambulatory Visit (INDEPENDENT_AMBULATORY_CARE_PROVIDER_SITE_OTHER): Payer: 59

## 2015-11-13 DIAGNOSIS — T63441D Toxic effect of venom of bees, accidental (unintentional), subsequent encounter: Secondary | ICD-10-CM | POA: Diagnosis not present

## 2016-01-06 ENCOUNTER — Ambulatory Visit (INDEPENDENT_AMBULATORY_CARE_PROVIDER_SITE_OTHER): Payer: 59 | Admitting: *Deleted

## 2016-01-06 DIAGNOSIS — T63441D Toxic effect of venom of bees, accidental (unintentional), subsequent encounter: Secondary | ICD-10-CM

## 2016-01-09 ENCOUNTER — Ambulatory Visit (INDEPENDENT_AMBULATORY_CARE_PROVIDER_SITE_OTHER): Payer: 59 | Admitting: Family Medicine

## 2016-01-09 ENCOUNTER — Encounter: Payer: Self-pay | Admitting: Family Medicine

## 2016-01-09 VITALS — BP 126/84 | Ht 65.0 in | Wt 95.0 lb

## 2016-01-09 DIAGNOSIS — M7742 Metatarsalgia, left foot: Secondary | ICD-10-CM

## 2016-01-09 DIAGNOSIS — R269 Unspecified abnormalities of gait and mobility: Secondary | ICD-10-CM | POA: Diagnosis not present

## 2016-01-09 DIAGNOSIS — M7741 Metatarsalgia, right foot: Secondary | ICD-10-CM

## 2016-01-09 NOTE — Progress Notes (Signed)
Tammy Shields - 60 y.o. female MRN [id number]  Date of birth: 06-08-1956  CC: Orthotics  SUBJECTIVE:   HPI  Tammy Shields is a very pleasant 60 year old female who presents for orthotic construction at the request of Dr. Layne Benton. She's been having some forefoot pain for several months. She recently had an x-ray which was negative for a stress fracture. She has both dorsal and plantar pain, most notably on the left lateral forefoot. She's had orthotics previously made by [place] over 7 years ago which have lasted quite well. She did have metatarsal cookies added recently which seems to have helped her forefoot pain.   ROS:     As above no fevers chills or night sweats. No rash or joint swelling.  HISTORY: Past Medical, Surgical, Social, and Family History Reviewed & Updated per EMR.  Pertinent Historical Findings include: Osteoporosis, dentist  OBJECTIVE: BP 126/84 mmHg  Ht 5\' 5"  (1.651 m)  Wt 95 lb (43.092 kg)  BMI 15.81 kg/m2  Physical Exam Calm, no acute distress Nonlabored breathing  On left foot patient has hallux valgus deformity of great toe and first MT: there is associated loss of longitudinal arch and pes planus: Loss of transverse arch with breakdown of MTs and subluxation through the capsule ; small bunionette present. Slightly tender to palpation over the distal fourth metatarsal dorsally as well as plantarly. No bruising or swelling present.  On right foot patient has hallux valgus deformity of great toe and first MT; there is associated loss of longitudinal arch and pes planus; Loss of transverse arch with breakdown of MTs and subluxation through the capsule; small bunionette present.  Patient was fitted for: 2 pairs of standard, cushioned, semi-rigid orthotic. The orthotic was heated and afterward the patient stood on the orthotic blank positioned on the orthotic stand. The patient was positioned in subtalar neutral position and 10 degrees of ankle dorsiflexion in a weight bearing  stance. After completion of molding, a stable base was applied to the orthotic blank. The blank was ground to a stable position for weight bearing. Size:6 Base: Blue EVA Posting: Metatarsal cookies bilaterally Additional orthotic padding: None  MEDICATIONS, LABS & OTHER ORDERS: Previous Medications   CALCIUM CARBONATE (OS-CAL) 600 MG TABS    Take 600 mg by mouth 2 (two) times daily with a meal.     CALCIUM-PHOSPHORUS-VITAMIN D (CITRACAL CALCIUM GUMMIES) 250-115-250 MG-MG-UNIT CHEW    Chew 1 Units by mouth daily.   CHOLECALCIFEROL (VITAMIN D) 1000 UNITS CAPSULE    Take 2,000 Units by mouth daily.    EPINEPHRINE 0.3 MG/0.3 ML IJ SOAJ INJECTION    Use as directed for life-threatening allergic reaction.   IBUPROFEN (ADVIL,MOTRIN) 200 MG TABLET    Take 200 mg by mouth. Hs prn    MAGNESIUM OXIDE (MAG-OX) 400 MG TABLET    Take 400 mg by mouth daily.   MULTIPLE VITAMINS-MINERALS (CENTRUM SILVER ADULT 50+) TABS    Take 1 tablet by mouth daily.   VITAMIN B-12 (CYANOCOBALAMIN) 100 MCG TABLET    Take 150 mcg by mouth daily.   VITAMIN C (ASCORBIC ACID) 500 MG TABLET    Take 500 mg by mouth daily.   Modified Medications   No medications on file   New Prescriptions   No medications on file   Discontinued Medications   No medications on file  No orders of the defined types were placed in this encounter.   ASSESSMENT & PLAN: Morton's changes of the foot with metatarsalgia: 2 pairs  of orthotics constructed as above. Metatarsal cookies added in both pairs of orthotics. We hope these help her. She reported good relief upon putting these on. We did give her a metatarsal bar to try out as well. We did discuss the possibility of a stress reaction considering her dorsal pain. She'll watch this closely. She will follow-up with Dr. Layne Benton as needed. Call with any questions or concerns regarding her orthotics.  60 minutes were spent constructing and adjusting her orthotics. More than half this time was  face-to-face.

## 2016-02-06 ENCOUNTER — Other Ambulatory Visit: Payer: 59 | Admitting: Internal Medicine

## 2016-02-06 DIAGNOSIS — E7889 Other lipoprotein metabolism disorders: Secondary | ICD-10-CM

## 2016-02-06 DIAGNOSIS — Z1329 Encounter for screening for other suspected endocrine disorder: Secondary | ICD-10-CM

## 2016-02-06 DIAGNOSIS — Z0000 Encounter for general adult medical examination without abnormal findings: Secondary | ICD-10-CM

## 2016-02-06 DIAGNOSIS — Z1321 Encounter for screening for nutritional disorder: Secondary | ICD-10-CM

## 2016-02-06 DIAGNOSIS — D708 Other neutropenia: Secondary | ICD-10-CM

## 2016-02-06 LAB — COMPLETE METABOLIC PANEL WITH GFR
ALT: 21 U/L (ref 6–29)
AST: 26 U/L (ref 10–35)
Albumin: 4.4 g/dL (ref 3.6–5.1)
Alkaline Phosphatase: 48 U/L (ref 33–130)
BILIRUBIN TOTAL: 0.3 mg/dL (ref 0.2–1.2)
BUN: 29 mg/dL — ABNORMAL HIGH (ref 7–25)
CHLORIDE: 104 mmol/L (ref 98–110)
CO2: 27 mmol/L (ref 20–31)
Calcium: 9.5 mg/dL (ref 8.6–10.4)
Creat: 0.78 mg/dL (ref 0.50–0.99)
GFR, EST NON AFRICAN AMERICAN: 82 mL/min (ref >=60)
GFR, Est African American: >89 mL/min (ref >=60)
GLUCOSE: 96 mg/dL (ref 65–99)
POTASSIUM: 4.8 mmol/L (ref 3.5–5.3)
SODIUM: 142 mmol/L (ref 135–146)
TOTAL PROTEIN: 6.8 g/dL (ref 6.1–8.1)

## 2016-02-06 LAB — LIPID PANEL
CHOL/HDL RATIO: 2.1 ratio (ref <=5.0)
Cholesterol: 230 mg/dL — ABNORMAL HIGH (ref 125–200)
HDL: 108 mg/dL (ref >=46)
LDL CALC: 111 mg/dL (ref <130)
TRIGLYCERIDES: 55 mg/dL (ref <150)
VLDL: 11 mg/dL (ref <30)

## 2016-02-06 LAB — TSH: TSH: 0.8 m[IU]/L

## 2016-02-07 LAB — VITAMIN D 25 HYDROXY (VIT D DEFICIENCY, FRACTURES): Vit D, 25-Hydroxy: 64 ng/mL (ref 30–100)

## 2016-02-13 ENCOUNTER — Encounter: Payer: Self-pay | Admitting: Internal Medicine

## 2016-02-13 ENCOUNTER — Ambulatory Visit (INDEPENDENT_AMBULATORY_CARE_PROVIDER_SITE_OTHER): Payer: 59 | Admitting: Internal Medicine

## 2016-02-13 VITALS — BP 130/80 | HR 74 | Temp 98.6°F | Resp 18 | Ht 65.0 in | Wt 99.0 lb

## 2016-02-13 DIAGNOSIS — Z658 Other specified problems related to psychosocial circumstances: Secondary | ICD-10-CM

## 2016-02-13 DIAGNOSIS — Z91038 Other insect allergy status: Secondary | ICD-10-CM | POA: Diagnosis not present

## 2016-02-13 DIAGNOSIS — Z862 Personal history of diseases of the blood and blood-forming organs and certain disorders involving the immune mechanism: Secondary | ICD-10-CM

## 2016-02-13 DIAGNOSIS — Z0000 Encounter for general adult medical examination without abnormal findings: Secondary | ICD-10-CM | POA: Diagnosis not present

## 2016-02-13 DIAGNOSIS — M791 Myalgia: Secondary | ICD-10-CM

## 2016-02-13 DIAGNOSIS — E7889 Other lipoprotein metabolism disorders: Secondary | ICD-10-CM

## 2016-02-13 DIAGNOSIS — R0989 Other specified symptoms and signs involving the circulatory and respiratory systems: Secondary | ICD-10-CM

## 2016-02-13 DIAGNOSIS — F439 Reaction to severe stress, unspecified: Secondary | ICD-10-CM

## 2016-02-13 DIAGNOSIS — M7918 Myalgia, other site: Secondary | ICD-10-CM

## 2016-02-13 DIAGNOSIS — K635 Polyp of colon: Secondary | ICD-10-CM

## 2016-02-21 NOTE — Progress Notes (Signed)
   Subjective:    Patient ID: Tammy Shields, female    DOB: 12-Nov-1955, 60 y.o.   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  Patient exercises a lot  and is actually underweight. Last year she weighed 96 pounds and now weighs 99 pounds. History of allergy to insect stings and she carries an EpiPen. History of musculoskeletal pain.  History of labral tear left shoulder 2006. History of osteopenia. She took Fosamax for a while but discontinued it. History of low white blood cell count in the past which is followed. White blood cell count in May 2011 was 2900, April 2010 was 3900, May 2008 was 2900 and in 2007 it was 3500. Recently had CBC done at tried urgent care and white blood cell count was 3200 which is stable. She has had peripheral review of smear in 2016 which showed absolute neutropenia. History of Raynaud's phenomenon. History of sciatica. History of constipation. History of bruxism. Had amenorrhea for 2 years in 2005. Last menstrual period was in 2008.  Social history: She is married to a retired Chief Financial Officer. 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 Nonsmoker. Social alcohol consumption.  Family history: Father died at age 52 with prostate cancer. Mother died at age 83 of cardiac arrest. Mother with history of diabetes, hypertension and cancer. One brother with diabetes. One sister in good health.  Last year she had a hemoglobin A1c of 5.7%.  She has a very high HDL cholesterol of 107.      Review of Systems  Respiratory: Negative.   Cardiovascular: Negative.   Genitourinary: Negative.   Musculoskeletal:       Musculoskeletal pain particularly in buttock.  Neurological: Negative.        Objective:   Physical Exam  Constitutional: She is oriented to person, place, and time. She appears well-developed and well-nourished. No distress.  HENT:  Head:  Normocephalic and atraumatic.  Right Ear: External ear normal.  Left Ear: External ear normal.  Mouth/Throat: Oropharynx is clear and moist. No oropharyngeal exudate.  Neck: No JVD present. No thyromegaly present.  Cardiovascular: Normal rate, regular rhythm, normal heart sounds and intact distal pulses.   No murmur heard. Pulmonary/Chest: Effort normal and breath sounds normal. She has no wheezes. She has no rales.  Breasts small without masses  Abdominal: Soft. Bowel sounds are normal. She exhibits no distension and no mass. There is no tenderness. There is no rebound and no guarding.  Genitourinary:  Genitourinary Comments: Pap done 2014  Musculoskeletal: She exhibits no edema.  Lymphadenopathy:    She has no cervical adenopathy.  Neurological: She is alert and oriented to person, place, and time. She has normal reflexes. No cranial nerve deficit. Coordination normal.  Skin: Skin is warm and dry. No rash noted. She is not diaphoretic.  Psychiatric: She has a normal mood and affect. Her behavior is normal. Judgment and thought content normal.  Vitals reviewed.         Assessment & Plan:  Musculoskeletal pain secondary to heavy exercise  Long-standing history of low white blood cell count being followed  Situational stress with mother-in-law being in poor health. Patient having to do errands for her and couldn't.  History of borderline elevated hemoglobin A1c at 5.7% in 2016  History of hyperplastic colon polyp on colonoscopy 2015  High HDL cholesterol  Plan: Return in one year or as needed.

## 2016-02-22 NOTE — Patient Instructions (Signed)
It was a pleasure to see you today. Return in one year or as needed. 

## 2016-03-01 DIAGNOSIS — T63441D Toxic effect of venom of bees, accidental (unintentional), subsequent encounter: Secondary | ICD-10-CM | POA: Diagnosis not present

## 2016-03-02 ENCOUNTER — Ambulatory Visit (INDEPENDENT_AMBULATORY_CARE_PROVIDER_SITE_OTHER): Payer: 59 | Admitting: *Deleted

## 2016-03-02 DIAGNOSIS — T63441D Toxic effect of venom of bees, accidental (unintentional), subsequent encounter: Secondary | ICD-10-CM | POA: Diagnosis not present

## 2016-03-26 ENCOUNTER — Other Ambulatory Visit: Payer: Self-pay | Admitting: Orthopaedic Surgery

## 2016-03-26 DIAGNOSIS — M25561 Pain in right knee: Secondary | ICD-10-CM

## 2016-04-05 ENCOUNTER — Ambulatory Visit
Admission: RE | Admit: 2016-04-05 | Discharge: 2016-04-05 | Disposition: A | Discharge status: Home / Self Care | Payer: 59 | Source: Ambulatory Visit | Attending: Orthopaedic Surgery | Admitting: Orthopaedic Surgery

## 2016-04-05 DIAGNOSIS — M25561 Pain in right knee: Secondary | ICD-10-CM

## 2016-04-09 ENCOUNTER — Encounter (INDEPENDENT_AMBULATORY_CARE_PROVIDER_SITE_OTHER): Payer: Self-pay

## 2016-04-12 ENCOUNTER — Encounter (INDEPENDENT_AMBULATORY_CARE_PROVIDER_SITE_OTHER): Payer: Self-pay

## 2016-04-26 DIAGNOSIS — T63441D Toxic effect of venom of bees, accidental (unintentional), subsequent encounter: Secondary | ICD-10-CM | POA: Diagnosis not present

## 2016-04-27 ENCOUNTER — Ambulatory Visit (INDEPENDENT_AMBULATORY_CARE_PROVIDER_SITE_OTHER): Payer: 59

## 2016-04-27 DIAGNOSIS — T63441D Toxic effect of venom of bees, accidental (unintentional), subsequent encounter: Secondary | ICD-10-CM | POA: Diagnosis not present

## 2016-06-22 ENCOUNTER — Ambulatory Visit (INDEPENDENT_AMBULATORY_CARE_PROVIDER_SITE_OTHER): Payer: 59 | Admitting: *Deleted

## 2016-06-22 DIAGNOSIS — T63441D Toxic effect of venom of bees, accidental (unintentional), subsequent encounter: Secondary | ICD-10-CM | POA: Diagnosis not present

## 2016-08-24 ENCOUNTER — Ambulatory Visit (INDEPENDENT_AMBULATORY_CARE_PROVIDER_SITE_OTHER): Payer: 59

## 2016-08-24 DIAGNOSIS — T63441D Toxic effect of venom of bees, accidental (unintentional), subsequent encounter: Secondary | ICD-10-CM | POA: Diagnosis not present

## 2016-09-17 ENCOUNTER — Encounter: Payer: 59 | Admitting: Obstetrics & Gynecology

## 2016-10-08 ENCOUNTER — Ambulatory Visit (INDEPENDENT_AMBULATORY_CARE_PROVIDER_SITE_OTHER): Payer: 59 | Admitting: Obstetrics & Gynecology

## 2016-10-08 ENCOUNTER — Encounter: Payer: Self-pay | Admitting: Obstetrics & Gynecology

## 2016-10-08 ENCOUNTER — Other Ambulatory Visit (HOSPITAL_COMMUNITY)
Admission: RE | Admit: 2016-10-08 | Discharge: 2016-10-08 | Disposition: A | Discharge status: Home / Self Care | Payer: 59 | Source: Ambulatory Visit | Attending: Obstetrics & Gynecology | Admitting: Obstetrics & Gynecology

## 2016-10-08 VITALS — BP 108/60 | HR 64 | Resp 16 | Ht 64.25 in | Wt 97.0 lb

## 2016-10-08 DIAGNOSIS — Z205 Contact with and (suspected) exposure to viral hepatitis: Secondary | ICD-10-CM | POA: Diagnosis not present

## 2016-10-08 DIAGNOSIS — Z01419 Encounter for gynecological examination (general) (routine) without abnormal findings: Secondary | ICD-10-CM

## 2016-10-08 DIAGNOSIS — Z124 Encounter for screening for malignant neoplasm of cervix: Secondary | ICD-10-CM

## 2016-10-08 DIAGNOSIS — N898 Other specified noninflammatory disorders of vagina: Secondary | ICD-10-CM

## 2016-10-08 NOTE — Progress Notes (Signed)
61 y.o. G0P0000 MarriedCaucasianF here as a new patient for annual exam/new patient visit.  PMP and never on HRT.  Regular/avid exerciser.  Pt states she does have vaginal dryness and sometimes painful intercourse.  Not regularly SA but would like some recommendations.  Also, reports some recent vaginal/vulvar irritation/itching and would like some recommendations.  No LMP recorded. Patient is postmenopausal.          Sexually active: Yes.    The current method of family planning is post menopausal status.    Exercising: Yes.    walking, eliptical, yoga Smoker:  no  Health Maintenance: Pap:  02/06/16 per patient  History of abnormal Pap:  Yes; years ago per patient MMG:  01/10/15 BIRADS 1 negative; Density D Colonoscopy:  09/07/13; Benign polyp BMD:   01/10/15 - osteoporosis - see EPIC TDaP:  04/02/2011  Pneumonia vaccine(s):  never Zostavax:   never Hep C testing: discuss today Screening Labs: PCP   reports that she has never smoked. She has never used smokeless tobacco. She reports that she drinks alcohol. She reports that she does not use drugs.  Past Medical History:  Diagnosis Date  . Basal cell carcinoma 2008  . Osteoporosis   . Postmenopausal   . Raynaud's disease     Past Surgical History:  Procedure Laterality Date  . BIOPSY ENDOMETRIAL  2006    Current Outpatient Prescriptions  Medication Sig Dispense Refill  . Cholecalciferol (VITAMIN D) 1000 UNITS capsule Take 2,000 Units by mouth daily.     . Cyanocobalamin (VITAMIN B12 PO) Take 300 mcg by mouth daily.     Marland Kitchen EPINEPHrine 0.3 mg/0.3 mL IJ SOAJ injection Use as directed for life-threatening allergic reaction. 2 Device 3  . ibuprofen (ADVIL,MOTRIN) 200 MG tablet Take 200 mg by mouth. Hs prn     . magnesium oxide (MAG-OX) 400 MG tablet Take 400 mg by mouth daily.    . Multiple Vitamins-Minerals (CENTRUM SILVER ADULT 50+) TABS Take 1 tablet by mouth daily.    . NON FORMULARY Ca(Life Extension Bone Restore with Vit K2)   Take 3 capsules po once daily.    . Omega-3 Fatty Acids (FISH OIL PO) Take 360 mg by mouth daily.    . prednisoLONE acetate (PRED FORTE) 1 % ophthalmic suspension Place 1 drop into the left eye 4 (four) times daily.  0  . Turmeric (CURCUMIN 95 PO) Take 400 mg by mouth daily.    . vitamin C (ASCORBIC ACID) 500 MG tablet Take 500 mg by mouth daily.     No current facility-administered medications for this visit.     Family History  Problem Relation Age of Onset  . Heart disease Mother   . Diabetes Mother   . Lung cancer Mother   . Hypertension Mother   . Prostate cancer Father   . Hypertension Sister   . Diabetes Brother     ROS:  Pertinent items are noted in HPI.  Otherwise, a comprehensive ROS was negative.  Exam:   BP 108/60 (BP Location: Right Arm, Patient Position: Sitting, Cuff Size: Normal)   Pulse 64   Resp 16   Ht 5' 4.25" (1.632 m)   Wt 97 lb (44 kg)   BMI 16.52 kg/m    Height: 5' 4.25" (163.2 cm)  Ht Readings from Last 3 Encounters:  10/08/16 5' 4.25" (1.632 m)  04/12/16 5\' 4"  (1.626 m)  04/09/16 5\' 4"  (1.626 m)   General appearance: alert, cooperative and appears stated age Head:  Normocephalic, without obvious abnormality, atraumatic Neck: no adenopathy, supple, symmetrical, trachea midline and thyroid normal to inspection and palpation Lungs: clear to auscultation bilaterally Breasts: normal appearance, no masses or tenderness Heart: regular rate and rhythm Abdomen: soft, non-tender; bowel sounds normal; no masses,  no organomegaly Extremities: extremities normal, atraumatic, no cyanosis or edema Skin: Skin color, texture, turgor normal. No rashes or lesions Lymph nodes: Cervical, supraclavicular, and axillary nodes normal. No abnormal inguinal nodes palpated Neurologic: Grossly normal   Pelvic: External genitalia:  Small excoriated area on left labia majora              Urethra:  normal appearing urethra with no masses, tenderness or lesions               Bartholins and Skenes: normal                 Vagina: normal appearing vagina with normal color and discharge, no lesions              Cervix: no lesions              Pap taken: Yes.   Bimanual Exam:  Uterus:  normal size, contour, position, consistency, mobility, non-tender              Adnexa: normal adnexa and no mass, fullness, tenderness               Rectovaginal: Confirms               Anus:  normal sphincter tone, no lesions  Chaperone was present for exam.  A:  Well Woman with normal exam PMP, no HRT Vaginal atrophic changes Dyspareunia  P:   Mammogram guidelines reviewed.  Recommended 3D due to grade 4 breast density.  Information for yearly MMG given. pap smear and HR HPV obtained today Hep C antibody obtained today Shingles vaccination discussed Affirm pending.  Likely will be negative.  Will treat with topical estrogen or vaginal steroid suppositories, depending on pt's preference once test results are back. return annually or prn

## 2016-10-09 LAB — WET PREP BY MOLECULAR PROBE
Candida species: NOT DETECTED
GARDNERELLA VAGINALIS: NOT DETECTED
TRICHOMONAS VAG: NOT DETECTED

## 2016-10-09 LAB — HEPATITIS C ANTIBODY: HCV Ab: NEGATIVE

## 2016-10-11 ENCOUNTER — Other Ambulatory Visit: Payer: Self-pay | Admitting: Internal Medicine

## 2016-10-11 DIAGNOSIS — Z1231 Encounter for screening mammogram for malignant neoplasm of breast: Secondary | ICD-10-CM

## 2016-10-13 ENCOUNTER — Telehealth: Payer: Self-pay

## 2016-10-13 LAB — CYTOLOGY - PAP
DIAGNOSIS: NEGATIVE
HPV: NOT DETECTED

## 2016-10-13 NOTE — Telephone Encounter (Signed)
Patient returning your call.

## 2016-10-13 NOTE — Telephone Encounter (Signed)
Spoke with patient. Advised of message and results as seen below from Tammy Shields. Patient verbalizes understanding. Patient would like to try OTC products for now. Will call back if she changes her mind and would like to try estrogen cream or vaginal tablets.  Routing to provider for final review. Patient agreeable to disposition. Will close encounter.

## 2016-10-13 NOTE — Telephone Encounter (Signed)
Left message to call Tammy Shields at [phone]. 

## 2016-10-13 NOTE — Telephone Encounter (Signed)
Left message to call Kaitlyn at [phone]. 

## 2016-10-13 NOTE — Telephone Encounter (Signed)
-----   Message from Megan Salon, MD sent at 10/12/2016  5:50 PM EDT ----- Please let pt know her Hep C and Affirm testing were negative.  We discussed treating vaginal irritation with either vaginal estrogen cream or tablets vs topical OTC products.  If she's like to try an estrogen product, I would recommend estrace cream 1gm pv twice weekly.  #42.5gm or vagifem 69meq 1 tab pv twice weekly.  Recheck 3 months if decides to start and estrogen.   Her pap smear is not back yet.  Thanks.

## 2016-10-19 ENCOUNTER — Other Ambulatory Visit: Payer: Self-pay | Admitting: Allergy and Immunology

## 2016-10-19 ENCOUNTER — Ambulatory Visit (INDEPENDENT_AMBULATORY_CARE_PROVIDER_SITE_OTHER): Payer: 59 | Admitting: *Deleted

## 2016-10-19 DIAGNOSIS — T63441D Toxic effect of venom of bees, accidental (unintentional), subsequent encounter: Secondary | ICD-10-CM

## 2016-10-29 ENCOUNTER — Ambulatory Visit
Admission: RE | Admit: 2016-10-29 | Discharge: 2016-10-29 | Disposition: A | Discharge status: Home / Self Care | Payer: 59 | Source: Ambulatory Visit | Attending: Internal Medicine | Admitting: Internal Medicine

## 2016-10-29 DIAGNOSIS — Z1231 Encounter for screening mammogram for malignant neoplasm of breast: Secondary | ICD-10-CM

## 2016-11-02 ENCOUNTER — Encounter: Payer: Self-pay | Admitting: Allergy and Immunology

## 2016-11-02 ENCOUNTER — Ambulatory Visit (INDEPENDENT_AMBULATORY_CARE_PROVIDER_SITE_OTHER): Payer: 59 | Admitting: Allergy and Immunology

## 2016-11-02 VITALS — BP 112/70 | HR 60 | Resp 18

## 2016-11-02 DIAGNOSIS — T63441D Toxic effect of venom of bees, accidental (unintentional), subsequent encounter: Secondary | ICD-10-CM

## 2016-11-02 MED ORDER — AUVI-Q 0.3 MG/0.3ML IJ SOAJ: INTRAMUSCULAR | 3 refills | Status: DC

## 2016-11-02 NOTE — Progress Notes (Signed)
Follow-up Note  Referring Provider: Elby Showers, MD Primary Provider: Elby Showers, MD Date of Office Visit: 11/02/2016  Subjective:   Tammy Shields (DOB: 1956/01/02) is a 61 y.o. female who returns to the [facility] on 11/02/2016 in re-evaluation of the following:  HPI: Tammy Shields return to this clinic in reevaluation of her Hymenoptera venom hypersensitivity state presently treated with immunotherapy every 8 weeks. She has not had any adverse effect from utilizing this form of therapy. She has not had a field sting.  Allergies as of 11/02/2016      Reactions   Bee Venom Anaphylaxis   MIXED VESPID, HONEY BEE      Medication List      CENTRUM SILVER ADULT 50+ Tabs Take 1 tablet by mouth daily.   CURCUMIN 95 PO Take 400 mg by mouth daily.   EPINEPHrine 0.3 mg/0.3 mL Soaj injection Commonly known as:  EPI-PEN Use as directed for life-threatening allergic reaction.   FISH OIL PO Take 360 mg by mouth daily.   ibuprofen 200 MG tablet Commonly known as:  ADVIL,MOTRIN Take 200 mg by mouth. Hs prn   magnesium oxide 400 MG tablet Commonly known as:  MAG-OX Take 400 mg by mouth daily.   NON FORMULARY Ca(Life Extension Bone Restore with Vit K2)  Take 3 capsules po once daily.   prednisoLONE acetate 1 % ophthalmic suspension Commonly known as:  PRED FORTE Place 1 drop into the left eye 4 (four) times daily.   VITAMIN B12 PO Take 300 mcg by mouth daily.   vitamin C 500 MG tablet Commonly known as:  ASCORBIC ACID Take 500 mg by mouth daily.   Vitamin D 1000 units capsule Take 2,000 Units by mouth daily.       Past Medical History:  Diagnosis Date  . Basal cell carcinoma 2008  . Hymenoptera allergy   . Osteoporosis   . Postmenopausal   . Raynaud's disease     Past Surgical History:  Procedure Laterality Date  . BIOPSY ENDOMETRIAL  2006  . BREAST CYST ASPIRATION  12/02/1995    Review of systems negative except as noted in HPI / PMHx  or noted below:  Review of Systems  Constitutional: Negative.   HENT: Negative.   Eyes: Negative.   Respiratory: Negative.   Cardiovascular: Negative.   Gastrointestinal: Negative.   Genitourinary: Negative.   Musculoskeletal: Negative.   Skin: Negative.   Neurological: Negative.   Endo/Heme/Allergies: Negative.   Psychiatric/Behavioral: Negative.      Objective:   Vitals:   11/02/16 1729  BP: 112/70  Pulse: 60  Resp: 18          Physical Exam  Constitutional: She is well-developed, well-nourished, and in no distress.  HENT:  Head: Normocephalic.  Right Ear: Tympanic membrane, external ear and ear canal normal.  Left Ear: Tympanic membrane, external ear and ear canal normal.  Nose: Nose normal. No mucosal edema or rhinorrhea.  Mouth/Throat: Uvula is midline, oropharynx is clear and moist and mucous membranes are normal. No oropharyngeal exudate.  Eyes: Conjunctivae are normal.  Neck: Trachea normal. No tracheal tenderness present. No tracheal deviation present. No thyromegaly present.  Cardiovascular: Normal rate, regular rhythm, S1 normal, S2 normal and normal heart sounds.   No murmur heard. Pulmonary/Chest: Breath sounds normal. No stridor. No respiratory distress. She has no wheezes. She has no rales.  Musculoskeletal: She exhibits no edema.  Lymphadenopathy:       Head (right  side): No tonsillar adenopathy present.       Head (left side): No tonsillar adenopathy present.    She has no cervical adenopathy.  Neurological: She is alert. Gait normal.  Skin: No rash noted. She is not diaphoretic. No erythema. Nails show no clubbing.  Psychiatric: Mood and affect normal.    Diagnostics: None   Assessment and Plan:   1. Toxic effect of venom of bees, unintentional, subsequent encounter     1. Continue immunotherapy   2. Auvi-Q .30, benadryl, M.D./ER evaluation for allergic reaction  3. Return to clinic in 1 year or earlier if problem  Tammy Shields will  continue on immunotherapy directed against Hymenoptera venom and I will see her back in this clinic in 1 year or earlier if there is a problem.  Allena Katz, MD Allergy / Immunology [facility]

## 2016-11-02 NOTE — Patient Instructions (Addendum)
  1. Continue immunotherapy   2. Auvi-Q .30, benadryl, M.D./ER evaluation for allergic reaction  3. Return to clinic in 1 year or earlier if problem

## 2016-12-14 ENCOUNTER — Ambulatory Visit (INDEPENDENT_AMBULATORY_CARE_PROVIDER_SITE_OTHER): Payer: 59 | Admitting: *Deleted

## 2016-12-14 DIAGNOSIS — T63441D Toxic effect of venom of bees, accidental (unintentional), subsequent encounter: Secondary | ICD-10-CM | POA: Diagnosis not present

## 2017-02-08 ENCOUNTER — Ambulatory Visit (INDEPENDENT_AMBULATORY_CARE_PROVIDER_SITE_OTHER): Payer: 59 | Admitting: *Deleted

## 2017-02-08 DIAGNOSIS — T63441D Toxic effect of venom of bees, accidental (unintentional), subsequent encounter: Secondary | ICD-10-CM | POA: Diagnosis not present

## 2017-03-11 ENCOUNTER — Emergency Department (HOSPITAL_COMMUNITY)
Admission: EM | Admit: 2017-03-11 | Discharge: 2017-03-11 | Disposition: A | Discharge status: Home / Self Care | Payer: 59 | Attending: Emergency Medicine | Admitting: Emergency Medicine

## 2017-03-11 ENCOUNTER — Emergency Department (HOSPITAL_BASED_OUTPATIENT_CLINIC_OR_DEPARTMENT_OTHER): Admit: 2017-03-11 | Discharge: 2017-03-11 | Disposition: A | Discharge status: Home / Self Care | Payer: 59

## 2017-03-11 ENCOUNTER — Encounter (HOSPITAL_COMMUNITY): Payer: Self-pay | Admitting: *Deleted

## 2017-03-11 DIAGNOSIS — R2241 Localized swelling, mass and lump, right lower limb: Secondary | ICD-10-CM | POA: Diagnosis present

## 2017-03-11 DIAGNOSIS — M7989 Other specified soft tissue disorders: Secondary | ICD-10-CM

## 2017-03-11 DIAGNOSIS — Z79899 Other long term (current) drug therapy: Secondary | ICD-10-CM | POA: Diagnosis not present

## 2017-03-11 LAB — CBC WITH DIFFERENTIAL/PLATELET
BASOS ABS: 0 10*3/uL (ref 0.0–0.1)
Basophils Relative: 1 %
EOS ABS: 0.1 10*3/uL (ref 0.0–0.7)
Eosinophils Relative: 1 %
HCT: 38.3 % (ref 36.0–46.0)
HEMOGLOBIN: 13.2 g/dL (ref 12.0–15.0)
LYMPHS PCT: 31 %
Lymphs Abs: 1.1 10*3/uL (ref 0.7–4.0)
MCH: 32.4 pg (ref 26.0–34.0)
MCHC: 34.5 g/dL (ref 30.0–36.0)
MCV: 93.9 fL (ref 78.0–100.0)
Monocytes Absolute: 0.2 10*3/uL (ref 0.1–1.0)
Monocytes Relative: 7 %
NEUTROS PCT: 60 %
Neutro Abs: 2.2 10*3/uL (ref 1.7–7.7)
PLATELETS: 181 10*3/uL (ref 150–400)
RBC: 4.08 MIL/uL (ref 3.87–5.11)
RDW: 12.8 % (ref 11.5–15.5)
WBC: 3.6 10*3/uL — AB (ref 4.0–10.5)

## 2017-03-11 LAB — BASIC METABOLIC PANEL
Anion gap: 8 (ref 5–15)
BUN: 21 mg/dL — AB (ref 6–20)
CHLORIDE: 104 mmol/L (ref 101–111)
CO2: 29 mmol/L (ref 22–32)
Calcium: 9.5 mg/dL (ref 8.9–10.3)
Creatinine, Ser: 0.83 mg/dL (ref 0.44–1.00)
Glucose, Bld: 97 mg/dL (ref 65–99)
POTASSIUM: 3.9 mmol/L (ref 3.5–5.1)
SODIUM: 141 mmol/L (ref 135–145)

## 2017-03-11 NOTE — ED Triage Notes (Signed)
Patient is alert and oriented x4.  She is being seen for right calf swelling.  Patient states that she noticed that her calf was swelling today but does not recall any trauma to the leg area.

## 2017-03-11 NOTE — ED Provider Notes (Signed)
[facility] DEPT Provider Note   CSN: [id number] Arrival date & time: 03/11/17  1310     History   Chief Complaint Chief Complaint  Patient presents with  . Leg Swelling    HPI Tammy Shields is a 61 y.o. female.  Tammy Shields is a 61 y.o. Female who presents to the ED complaining of right calf swelling starting today. She reports she first noticed an area of swelling earlier today. She reports the area a few cm in size that is TTP. She reports it seems to be getting better than it was before. She denies any known insect bite or sting. No treatments prior to arrival. She denies hx of PE or DVT. She denies personal or close family history of blood clotting disorders such as factor V Leiden, protein C or S deficiency. No recent long travel or endogenous estrogen use. She denies recent surgery. She denies fevers, coughing, chest pain, shortness of breath, abdominal pain, numbness, tingling, weakness, rashes, body aches.    The history is provided by the patient and medical records. No language interpreter was used.    Past Medical History:  Diagnosis Date  . Basal cell carcinoma 2008  . Hymenoptera allergy   . Osteoporosis   . Postmenopausal   . Raynaud's disease     Patient Active Problem List   Diagnosis Date Noted  . Anaphylaxis due to hymenoptera venom 04/05/2015  . Neutropenia 04/22/2011  . Allergic to insect stings 04/22/2011  . Musculoskeletal pain 04/22/2011    Past Surgical History:  Procedure Laterality Date  . BIOPSY ENDOMETRIAL  2006  . BREAST CYST ASPIRATION  12/02/1995    OB History    Gravida Para Term Preterm AB Living   0 0 0 0 0 0   SAB TAB Ectopic Multiple Live Births   0 0 0 0 0       Home Medications    Prior to Admission medications   Medication Sig Start Date End Date Taking? Authorizing Provider  aspirin 325 MG EC tablet Take 325 mg by mouth once.   Yes [provider]  Cholecalciferol (VITAMIN D) 1000 UNITS capsule Take  2,000 Units by mouth daily.    Yes [provider]  Cyanocobalamin (VITAMIN B12 PO) Take 300 mcg by mouth daily.    Yes [provider]  ibuprofen (ADVIL,MOTRIN) 200 MG tablet Take 400 mg by mouth every 6 (six) hours as needed for mild pain. Hs prn    Yes [provider]  magnesium oxide (MAG-OX) 400 MG tablet Take 400 mg by mouth daily.   Yes [provider]  Multiple Vitamins-Minerals (CENTRUM SILVER ADULT 50+) TABS Take 1 tablet by mouth daily.   Yes [provider]  NON FORMULARY Ca(Life Extension Bone Restore with Vit K2)  Take 3 capsules po once daily.   Yes [provider]  Omega-3 Fatty Acids (FISH OIL PO) Take 360 mg by mouth daily.   Yes [provider]  Turmeric (CURCUMIN 95 PO) Take 400 mg by mouth daily.   Yes [provider]  vitamin C (ASCORBIC ACID) 500 MG tablet Take 500 mg by mouth daily.   Yes [provider]  AUVI-Q 0.3 MG/0.3ML SOAJ injection Use as directed for life-threatening allergic reaction. 11/02/16   Kozlow, Donnamarie Poag, MD    Family History Family History  Problem Relation Age of Onset  . Heart disease Mother   . Diabetes Mother   . Lung cancer Mother   .  Hypertension Mother   . Prostate cancer Father   . Hypertension Sister   . Diabetes Brother     Social History Social History  Substance Use Topics  . Smoking status: Never Smoker  . Smokeless tobacco: Never Used  . Alcohol use 0.0 oz/week    8 - 10 Glasses of wine per week     Comment: socially     Allergies   Bee venom   Review of Systems Review of Systems  Constitutional: Negative for chills and fever.  HENT: Negative for congestion and sore throat.   Eyes: Negative for visual disturbance.  Respiratory: Negative for cough and shortness of breath.   Cardiovascular: Positive for leg swelling. Negative for chest pain and palpitations.  Gastrointestinal: Negative for abdominal pain, nausea and vomiting.    Genitourinary: Negative for dysuria.  Musculoskeletal: Negative for arthralgias, back pain and neck pain.  Skin: Negative for color change, rash and wound.  Neurological: Negative for weakness, light-headedness, numbness and headaches.     Physical Exam Updated Vital Signs BP 134/87 (BP Location: Left Arm)   Pulse 82   Temp 98.7 F (37.1 C)   Resp 18   Ht 5' 4.5" (1.638 m)   Wt 43.1 kg (95 lb)   SpO2 99%   BMI 16.05 kg/m   Physical Exam  Constitutional: She appears well-developed and well-nourished. No distress.  HENT:  Head: Normocephalic and atraumatic.  Mouth/Throat: Oropharynx is clear and moist.  Eyes: Pupils are equal, round, and reactive to light. Conjunctivae are normal. Right eye exhibits no discharge. Left eye exhibits no discharge.  Neck: Neck supple.  Cardiovascular: Normal rate, regular rhythm, normal heart sounds and intact distal pulses.  Exam reveals no gallop and no friction rub.   No murmur heard. Bilateral radial, posterior tibialis and dorsalis pedis pulses are intact.    Pulmonary/Chest: Effort normal and breath sounds normal. No respiratory distress. She has no wheezes. She has no rales.  Lungs are clear to ascultation bilaterally. Symmetric chest expansion bilaterally. No increased work of breathing. No rales or rhonchi.    Abdominal: Soft. There is no tenderness. There is no guarding.  Musculoskeletal: Normal range of motion. She exhibits edema. She exhibits no tenderness or deformity.  Small focal area of edema noted to her right posterior calf about 2 cm in size. No overlying skin changes. No ankle or pedal edema.   Lymphadenopathy:    She has no cervical adenopathy.  Neurological: She is alert. No sensory deficit. She exhibits normal muscle tone. Coordination normal.  Skin: Skin is warm and dry. Capillary refill takes less than 2 seconds. No rash noted. She is not diaphoretic. No erythema. No pallor.  Psychiatric: She has a normal mood and affect.  Her behavior is normal.  Nursing note and vitals reviewed.    ED Treatments / Results  Labs (all labs ordered are listed, but only abnormal results are displayed) Labs Reviewed  BASIC METABOLIC PANEL - Abnormal; Notable for the following:       Result Value   BUN 21 (*)    All other components within normal limits  CBC WITH DIFFERENTIAL/PLATELET - Abnormal; Notable for the following:    WBC 3.6 (*)    All other components within normal limits    EKG  EKG Interpretation None       Radiology No results found.  Procedures Procedures (including critical care time)  Medications Ordered in ED Medications - No data to display   Initial Impression /  Assessment and Plan / ED Course  I have reviewed the triage vital signs and the nursing notes.  Pertinent labs & imaging results that were available during my care of the patient were reviewed by me and considered in my medical decision making (see chart for details).    This is a 61 y.o. Female who presents to the ED complaining of right calf swelling starting today. She reports she first noticed an area of swelling earlier today. She reports the area a few cm in size that is TTP. She reports it seems to be getting better than it was before. She denies any known insect bite or sting. No treatments prior to arrival. She denies hx of PE or DVT. She denies personal or close family history of blood clotting disorders such as factor V Leiden, protein C or S deficiency. No recent long travel or endogenous estrogen use. She denies recent surgery.  No DVT risk factors noted.  On exam the patient is afebrile nontoxic appearing. She has a small 2-3 cm area of mild edema and tenderness noted to her right calf. There are diffuse leg swelling. No overlying skin changes. She is neurovascularly intact.  DVT study was obtained and this was negative for DVT. I did discuss test results of the patient. I discussed signs and symptoms of a DVT and  symptoms that would warrant return to the emergency department. I encouraged her to follow-up with primary care. I advised the patient to follow-up with their primary care provider this week. I advised the patient to return to the emergency department with new or worsening symptoms or new concerns. The patient verbalized understanding and agreement with plan.     Final Clinical Impressions(s) / ED Diagnoses   Final diagnoses:  Calf swelling    New Prescriptions New Prescriptions   No medications on file     Sharmaine Base 03/11/17 Bouton, Kevin, MD 03/11/17 [phone]

## 2017-03-11 NOTE — ED Notes (Addendum)
Pt refused vital signs at discharge. Verbalized understanding of discharge instructions.

## 2017-03-11 NOTE — Progress Notes (Signed)
**  Preliminary report by tech**  Right lower extremity venous duplex complete. There is no evidence of deep or superficial vein thrombosis involving the right lower extremity. All visualized vessels appear patent and compressible. There is no evidence of a Baker's cyst on the right. Results were given to Dr. Wilson Singer.  03/11/17 3:41 PM Tammy Shields RVT

## 2017-04-05 ENCOUNTER — Ambulatory Visit (INDEPENDENT_AMBULATORY_CARE_PROVIDER_SITE_OTHER): Payer: 59 | Admitting: *Deleted

## 2017-04-05 DIAGNOSIS — T63441D Toxic effect of venom of bees, accidental (unintentional), subsequent encounter: Secondary | ICD-10-CM

## 2017-05-31 ENCOUNTER — Ambulatory Visit (INDEPENDENT_AMBULATORY_CARE_PROVIDER_SITE_OTHER): Payer: 59 | Admitting: *Deleted

## 2017-05-31 DIAGNOSIS — T63441D Toxic effect of venom of bees, accidental (unintentional), subsequent encounter: Secondary | ICD-10-CM

## 2017-07-28 ENCOUNTER — Ambulatory Visit (INDEPENDENT_AMBULATORY_CARE_PROVIDER_SITE_OTHER): Payer: 59

## 2017-07-28 DIAGNOSIS — T63441D Toxic effect of venom of bees, accidental (unintentional), subsequent encounter: Secondary | ICD-10-CM

## 2017-09-27 ENCOUNTER — Ambulatory Visit (INDEPENDENT_AMBULATORY_CARE_PROVIDER_SITE_OTHER): Payer: No Typology Code available for payment source | Admitting: *Deleted

## 2017-09-27 DIAGNOSIS — T63441D Toxic effect of venom of bees, accidental (unintentional), subsequent encounter: Secondary | ICD-10-CM | POA: Diagnosis not present

## 2017-11-22 ENCOUNTER — Ambulatory Visit: Payer: Self-pay

## 2017-12-06 ENCOUNTER — Ambulatory Visit (INDEPENDENT_AMBULATORY_CARE_PROVIDER_SITE_OTHER): Payer: No Typology Code available for payment source | Admitting: *Deleted

## 2017-12-06 DIAGNOSIS — T63441D Toxic effect of venom of bees, accidental (unintentional), subsequent encounter: Secondary | ICD-10-CM | POA: Diagnosis not present

## 2018-01-06 ENCOUNTER — Encounter: Payer: Self-pay | Admitting: Obstetrics & Gynecology

## 2018-01-06 ENCOUNTER — Encounter

## 2018-01-06 ENCOUNTER — Other Ambulatory Visit: Payer: Self-pay

## 2018-01-06 ENCOUNTER — Ambulatory Visit (INDEPENDENT_AMBULATORY_CARE_PROVIDER_SITE_OTHER): Payer: No Typology Code available for payment source | Admitting: Obstetrics & Gynecology

## 2018-01-06 VITALS — BP 112/70 | HR 76 | Resp 16 | Ht 64.25 in | Wt 95.0 lb

## 2018-01-06 DIAGNOSIS — Z01419 Encounter for gynecological examination (general) (routine) without abnormal findings: Secondary | ICD-10-CM

## 2018-01-06 DIAGNOSIS — M816 Localized osteoporosis [Lequesne]: Secondary | ICD-10-CM

## 2018-01-06 NOTE — Progress Notes (Signed)
62 y.o. G0P0000 MarriedCaucasianF here for annual exam.  Doing well.  Is doing allergy shots.  Now only getting these about every two months.  Did have one ER visit last year after having some trauma to her calf.  Was evaluated for a DVT.  Denies vaginal bleeding.    No LMP recorded. Patient is postmenopausal.          Sexually active: Yes.    The current method of family planning is post menopausal status.    Exercising: Yes.    walk, weights Smoker:  no  Health Maintenance: Pap:  10/08/16 Neg. HR HPV:neg  History of abnormal Pap:  yes MMG:  10/29/16 BIRADS1:Neg  Colonoscopy:  09/07/13 f/u 5 years  BMD:   01/10/15 Osteoporosis  TDaP:  2012 Pneumonia vaccine(s):  No Shingrix:   no Hep C testing: 10/08/16 Neg  Screening Labs: here today    reports that she has never smoked. She has never used smokeless tobacco. She reports that she drinks about 4.8 - 6.0 oz of alcohol per week. She reports that she does not use drugs.  Past Medical History:  Diagnosis Date  . Basal cell carcinoma 2008  . Hymenoptera allergy   . Osteoporosis   . Postmenopausal   . Raynaud's disease     Past Surgical History:  Procedure Laterality Date  . BIOPSY ENDOMETRIAL  2006  . BREAST CYST ASPIRATION  12/02/1995    Current Outpatient Medications  Medication Sig Dispense Refill  . aspirin 325 MG EC tablet Take 325 mg by mouth every 6 (six) hours as needed.     . Cholecalciferol (VITAMIN D) 1000 UNITS capsule Take 2,000 Units by mouth daily.     . Cyanocobalamin (VITAMIN B12 PO) Take 300 mcg by mouth daily.     Marland Kitchen ibuprofen (ADVIL,MOTRIN) 200 MG tablet Take 400 mg by mouth every 6 (six) hours as needed for mild pain. Hs prn     . magnesium oxide (MAG-OX) 400 MG tablet Take 400 mg by mouth daily.    . Multiple Vitamins-Minerals (CENTRUM SILVER ADULT 50+) TABS Take 1 tablet by mouth daily.    . NON FORMULARY Ca(Life Extension Bone Restore with Vit K2)  Take 3 capsules po once daily.    . Omega-3 Fatty Acids  (FISH OIL PO) Take 360 mg by mouth daily.    . Turmeric (CURCUMIN 95 PO) Take 400 mg by mouth daily.    . vitamin C (ASCORBIC ACID) 500 MG tablet Take 500 mg by mouth daily.    Marland Kitchen AUVI-Q 0.3 MG/0.3ML SOAJ injection Use as directed for life-threatening allergic reaction. (Patient not taking: Reported on 01/06/2018) 4 Device 3   No current facility-administered medications for this visit.     Family History  Problem Relation Age of Onset  . Heart disease Mother   . Diabetes Mother   . Lung cancer Mother   . Hypertension Mother   . Prostate cancer Father   . Hypertension Sister   . Diabetes Brother     Review of Systems  Musculoskeletal: Positive for myalgias.  Neurological: Positive for headaches.  All other systems reviewed and are negative.   Exam:   BP 112/70 (BP Location: Left Arm, Patient Position: Sitting, Cuff Size: Normal)   Pulse 76   Resp 16   Ht 5' 4.25" (1.632 m)   Wt 95 lb (43.1 kg)   BMI 16.18 kg/m   Height: 5' 4.25" (163.2 cm)  Ht Readings from Last 3 Encounters:  01/06/18  5' 4.25" (1.632 m)  03/11/17 5' 4.5" (1.638 m)  10/08/16 5' 4.25" (1.632 m)    General appearance: alert, cooperative and appears stated age Head: Normocephalic, without obvious abnormality, atraumatic Neck: no adenopathy, supple, symmetrical, trachea midline and thyroid normal to inspection and palpation Lungs: clear to auscultation bilaterally Breasts: normal appearance, no masses or tenderness Heart: regular rate and rhythm Abdomen: soft, non-tender; bowel sounds normal; no masses,  no organomegaly Extremities: extremities normal, atraumatic, no cyanosis or edema Skin: Skin color, texture, turgor normal. No rashes or lesions Lymph nodes: Cervical, supraclavicular, and axillary nodes normal. No abnormal inguinal nodes palpated Neurologic: Grossly normal   Pelvic: External genitalia:  no lesions              Urethra:  normal appearing urethra with no masses, tenderness or lesions               Bartholins and Skenes: normal                 Vagina: normal appearing vagina with normal color and discharge, no lesions              Cervix: no lesions              Pap taken: No. Bimanual Exam:  Uterus:  normal size, contour, position, consistency, mobility, non-tender              Adnexa: normal adnexa and no mass, fullness, tenderness               Rectovaginal: Confirms               Anus:  normal sphincter tone, no lesions  Chaperone was present for exam.  A:  Well Woman with normal exam PMP, no HRT Vaginal atrophic changes Dyspareunia Osteoporosis  P:   Mammogram guidelines reviewed.  Does not do a yearly mammogram.  Recommended doing a 3D MMG. Pt considering BMD with next mammogram.  Order placed.   pap smear and HR HPV 3/18.  Not indicated today. Did not obtained any blood work today Reviewed vaccines Return annually or prn

## 2018-01-31 ENCOUNTER — Ambulatory Visit (INDEPENDENT_AMBULATORY_CARE_PROVIDER_SITE_OTHER): Payer: No Typology Code available for payment source | Admitting: *Deleted

## 2018-01-31 DIAGNOSIS — T63441D Toxic effect of venom of bees, accidental (unintentional), subsequent encounter: Secondary | ICD-10-CM | POA: Diagnosis not present

## 2018-03-28 ENCOUNTER — Ambulatory Visit (INDEPENDENT_AMBULATORY_CARE_PROVIDER_SITE_OTHER): Payer: No Typology Code available for payment source

## 2018-03-28 DIAGNOSIS — T63441D Toxic effect of venom of bees, accidental (unintentional), subsequent encounter: Secondary | ICD-10-CM | POA: Diagnosis not present

## 2018-05-23 ENCOUNTER — Ambulatory Visit (INDEPENDENT_AMBULATORY_CARE_PROVIDER_SITE_OTHER): Payer: No Typology Code available for payment source | Admitting: *Deleted

## 2018-05-23 DIAGNOSIS — T63441D Toxic effect of venom of bees, accidental (unintentional), subsequent encounter: Secondary | ICD-10-CM

## 2018-06-02 ENCOUNTER — Ambulatory Visit (INDEPENDENT_AMBULATORY_CARE_PROVIDER_SITE_OTHER): Payer: No Typology Code available for payment source

## 2018-06-02 ENCOUNTER — Ambulatory Visit (INDEPENDENT_AMBULATORY_CARE_PROVIDER_SITE_OTHER): Payer: No Typology Code available for payment source | Admitting: Orthopaedic Surgery

## 2018-06-02 ENCOUNTER — Encounter (INDEPENDENT_AMBULATORY_CARE_PROVIDER_SITE_OTHER): Payer: Self-pay | Admitting: Orthopaedic Surgery

## 2018-06-02 VITALS — BP 134/83 | HR 68 | Ht 64.0 in | Wt 95.0 lb

## 2018-06-02 DIAGNOSIS — G8929 Other chronic pain: Secondary | ICD-10-CM | POA: Diagnosis not present

## 2018-06-02 DIAGNOSIS — M25511 Pain in right shoulder: Secondary | ICD-10-CM

## 2018-06-02 MED ORDER — METHYLPREDNISOLONE 4 MG PO TABS: ORAL_TABLET | ORAL | 0 refills | Status: DC

## 2018-06-02 NOTE — Progress Notes (Signed)
Office Visit Note   Patient: Tammy Shields           Date of Birth: September 02, 1955           MRN: [id number] Visit Date: 06/02/2018              Requested by: Elby Showers, MD [address] [place], [place] [id number] PCP: Elby Showers, MD   Assessment & Plan: Visit Diagnoses:  1. Chronic right shoulder pain     Plan: Mild osteoarthritis cervical spine at C5-6 that I do not think is symptomatic.  Some mild symptoms referable to the right shoulder.  Residual numbness in the index and long finger might be carpal tunnel syndrome.  We will try a Medrol Dosepak and reevaluate over the next several weeks if still problem  Follow-Up Instructions: Return if symptoms worsen or fail to improve.   Orders:  Orders Placed This Encounter  Procedures  . XR Shoulder Right  . XR Cervical Spine 2 or 3 views   Meds ordered this encounter  Medications  . methylPREDNISolone (MEDROL) 4 MG tablet    Sig: DAY1 TAKE 6 TABS, DAY 2 TAKE 5 TABS, DAY 3 TAKE 4 TABS, DAY 4 TAKE 3 TABS, DAY 5 TAKE 2 TABS, DAY 6 TAKE 1 TAB    Dispense:  21 tablet    Refill:  0      Procedures: No procedures performed   Clinical Data: No additional findings.   Subjective: Chief Complaint  Patient presents with  . 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  4 days ago she noted in the morning that she was having some numbness and tingling into the ulnar 2 digits of her right hand associated with a little bit of soreness of her cervical spine and shoulder.  The day before she had been performing some activities around the house but nothing that she felt was unusual.  She did not have any injury or trauma.  She still is a little bit sore but still having some residual numbness in the index and long finger of  her right nondominant hand.  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  She has a history of Raynoud's. HPI  Review of Systems  Constitutional: Negative for fatigue and fever.  HENT: Negative for ear pain.   Eyes: Negative for pain.  Respiratory: Negative for cough and shortness of breath.   Cardiovascular: Negative for leg swelling.  Gastrointestinal: Negative for constipation and diarrhea.  Genitourinary: Negative for difficulty urinating.  Musculoskeletal: Negative for back pain and neck pain.  Skin: Negative for rash.  Allergic/Immunologic: Negative for food allergies.  Neurological: Positive for weakness and numbness.  Hematological: Does not bruise/bleed easily.  Psychiatric/Behavioral: Negative for self-injury.     Objective: Vital Signs: BP 134/83 (BP Location: Left Arm, Patient Position: Sitting, Cuff Size: Normal)   Pulse 68   Ht 5\' 4"  (1.626 m)   Wt 95 lb (43.1 kg)   BMI 16.31 kg/m   Physical Exam  Constitutional: She is oriented to person, place, and time. She appears well-developed and well-nourished.  HENT:  Mouth/Throat: Oropharynx is clear and moist.  Eyes: Pupils are equal, round, and reactive  to light. EOM are normal.  Pulmonary/Chest: Effort normal.  Neurological: She is alert and oriented to person, place, and time.  Skin: Skin is warm and dry.  Psychiatric: She has a normal mood and affect. Her behavior is normal.    Ortho Exam awake alert and oriented x3.  Comfortable sitting.  Full range of motion of cervical spine without any referred pain to either upper extremity.  No posterior or anterior cervical pain or mass.  Full range of motion of right shoulder with minimally positive impingement on the extremes of external rotation.  Good strength.  Negative empty can testing.  Biceps intact.  No pain around the acromioclavicular joint or the anterior subacromial region.  Good grip and release.   Negative Tinel's at the wrist.  Negative Phalen's.  Does have some arthritis at the base of both thumbs and Heberden's nodes of the DIP joints.  Full fist.  Able to oppose thumb to little finger with good strength  Specialty Comments:  No specialty comments available.  Imaging: No results found.   PMFS History: Patient Active Problem List   Diagnosis Date Noted  . Anaphylaxis due to hymenoptera venom 04/05/2015  . Neutropenia 04/22/2011  . Allergic to insect stings 04/22/2011  . Musculoskeletal pain 04/22/2011   Past Medical History:  Diagnosis Date  . Basal cell carcinoma 2008  . Hymenoptera allergy   . Osteoporosis   . Postmenopausal   . Raynaud's disease     Family History  Problem Relation Age of Onset  . Heart disease Mother   . Diabetes Mother   . Lung cancer Mother   . Hypertension Mother   . Prostate cancer Father   . Hypertension Sister   . Diabetes Brother     Past Surgical History:  Procedure Laterality Date  . BIOPSY ENDOMETRIAL  2006  . BREAST CYST ASPIRATION  12/02/1995   Social History   Occupational History  . Occupation: Pharmacist, community  Tobacco Use  . Smoking status: Never Smoker  . Smokeless tobacco: Never Used  Substance and Sexual Activity  . Alcohol use: Yes    Alcohol/week: 8.0 - 10.0 standard drinks    Types: 8 - 10 Glasses of wine per week    Comment: socially  . Drug use: No  . Sexual activity: Yes    Birth control/protection: Post-menopausal

## 2018-06-12 ENCOUNTER — Telehealth (INDEPENDENT_AMBULATORY_CARE_PROVIDER_SITE_OTHER): Payer: Self-pay | Admitting: Orthopaedic Surgery

## 2018-06-12 NOTE — Telephone Encounter (Signed)
Please advise 

## 2018-06-12 NOTE — Telephone Encounter (Signed)
Patient left a voicemail stating she has finished the prescription of Prednisone and still has numbness and tingling in her fingers when she brings her neck towards her chest.  Patient requested a return call before scheduling an appointment.

## 2018-06-13 ENCOUNTER — Other Ambulatory Visit (INDEPENDENT_AMBULATORY_CARE_PROVIDER_SITE_OTHER): Payer: Self-pay | Admitting: Radiology

## 2018-06-13 DIAGNOSIS — M542 Cervicalgia: Secondary | ICD-10-CM

## 2018-06-13 NOTE — Telephone Encounter (Signed)
Please schedule MRI of cervical spine

## 2018-06-14 NOTE — Telephone Encounter (Signed)
PUT REFERRAL IN

## 2018-06-17 ENCOUNTER — Other Ambulatory Visit: Payer: Self-pay

## 2018-06-19 ENCOUNTER — Telehealth (INDEPENDENT_AMBULATORY_CARE_PROVIDER_SITE_OTHER): Payer: Self-pay | Admitting: Orthopaedic Surgery

## 2018-06-19 NOTE — Telephone Encounter (Signed)
Patients MRI declined. Patient would like to know why? Also, what is next step. Patient has tried to get in touch with Gabriel Cirri for more information, and has left several messages. Please call to discuss.

## 2018-06-20 NOTE — Telephone Encounter (Signed)
Needs peer-to-peer, numbness from right shoulder down to hand, tenderness, swelling, I called patient and advised I will call her with results from peer-to-peer

## 2018-06-26 NOTE — Telephone Encounter (Signed)
Dr. Durward Fortes completed peer-to-peer - approved. I called patient with # to [facility] to schedule MRI appt.

## 2018-06-26 NOTE — Telephone Encounter (Signed)
MRI of c-spine approved with peer to peer today

## 2018-06-26 NOTE — Telephone Encounter (Signed)
Noted pt is scheduled on 06/30/18

## 2018-06-30 ENCOUNTER — Encounter: Payer: Self-pay | Admitting: Internal Medicine

## 2018-06-30 ENCOUNTER — Ambulatory Visit (INDEPENDENT_AMBULATORY_CARE_PROVIDER_SITE_OTHER): Payer: No Typology Code available for payment source | Admitting: Internal Medicine

## 2018-06-30 ENCOUNTER — Ambulatory Visit
Admission: RE | Admit: 2018-06-30 | Discharge: 2018-06-30 | Disposition: A | Discharge status: Home / Self Care | Payer: PRIVATE HEALTH INSURANCE | Source: Ambulatory Visit | Attending: Orthopaedic Surgery | Admitting: Orthopaedic Surgery

## 2018-06-30 VITALS — BP 102/80 | HR 61 | Temp 98.1°F | Ht 64.0 in | Wt 97.0 lb

## 2018-06-30 DIAGNOSIS — K648 Other hemorrhoids: Secondary | ICD-10-CM

## 2018-06-30 DIAGNOSIS — M542 Cervicalgia: Secondary | ICD-10-CM

## 2018-06-30 DIAGNOSIS — K625 Hemorrhage of anus and rectum: Secondary | ICD-10-CM | POA: Diagnosis not present

## 2018-06-30 MED ORDER — HYDROCORTISONE ACE-PRAMOXINE 1-1 % RE FOAM: 1.0000 | Freq: Four times a day (QID) | RECTAL | 11 refills | Status: DC

## 2018-07-07 ENCOUNTER — Ambulatory Visit: Payer: No Typology Code available for payment source | Admitting: Internal Medicine

## 2018-07-17 ENCOUNTER — Telehealth (INDEPENDENT_AMBULATORY_CARE_PROVIDER_SITE_OTHER): Payer: Self-pay | Admitting: Orthopaedic Surgery

## 2018-07-17 NOTE — Telephone Encounter (Signed)
Darisha from [facility] left a voicemail stating she was calling on behalf of Dr. Kristeen Miss.  She stated that Dr. Durward Fortes took xrays of  Her right upper extremity in addition to an MRI of her cervical spine.  Dr. Ellene Route is asking if those images could be sent over.  Please return my call at [phone] ext [phone]

## 2018-07-17 NOTE — Telephone Encounter (Signed)
CD of x-rays of C-spine, Right shoulder and MR of c-spine w/report at front desk. I called patient. LMOM at Dr. Clarice Pole office.

## 2018-07-18 ENCOUNTER — Ambulatory Visit (INDEPENDENT_AMBULATORY_CARE_PROVIDER_SITE_OTHER): Payer: No Typology Code available for payment source | Admitting: *Deleted

## 2018-07-18 DIAGNOSIS — T63441D Toxic effect of venom of bees, accidental (unintentional), subsequent encounter: Secondary | ICD-10-CM | POA: Diagnosis not present

## 2018-07-25 NOTE — Progress Notes (Signed)
   Subjective:    Patient ID: Tammy Shields, female    DOB: Apr 17, 1956, 62 y.o.   MRN: [id number]  HPI 62 year old Female in today with complaint of rectal bleeding.  Has seen bright red blood in the toilet.  Has had 2 episodes this week and previous episodes in late November. Has seen Dr. Durward Fortes for osteoarthritis C5-C6 of the cervical spine with chronic right shoulder pain.  Was on a Medrol Dosepak in November.  Also had  numbness in index and long finger thought possibly to be due to carpal tunnel syndrome and that is why the Medrol was prescribed.  Just had MRI of the C-spine which showed disc herniation C6-C7 with proximal foraminal encroachment that could affect the C7 nerve.  Receives immunotherapy for bee venom allergy per allergist.  General health is excellent and she exercises regularly.  No issues with constipation  Has taken ibuprofen for musculoskeletal pain  Had colonoscopy in 2015 with 2 sessile polyps noted that were not adenomatous and moderate internal hemorrhoids.  10-year follow-up recommended   Review of Systems see above     Objective:   Physical Exam Abdomen is soft nondistended without hepatosplenomegaly masses or tenderness.  No stool guaiac  Anoscopy reveals internal hemorrhoids       Assessment & Plan:  Rectal bleeding likely secondary to internal hemorrhoids and therapy for musculoskeletal pain  Plan: Prescribed ProctoCream-HC to use 4 times daily for couple of weeks.  She will notify me if rectal bleeding persist.  I think it will resolve.  She will avoid NSAIDs if possible for now.  Spent 35 minutes with patient taking history, discussing situation and performing physical exam and anoscopy.

## 2018-07-25 NOTE — Patient Instructions (Signed)
It was a pleasure to see you today.  Use ProctoCream-HC 4 times a day and follow-up if rectal bleeding does not resolve.

## 2018-09-12 ENCOUNTER — Ambulatory Visit (INDEPENDENT_AMBULATORY_CARE_PROVIDER_SITE_OTHER): Payer: No Typology Code available for payment source | Admitting: *Deleted

## 2018-09-12 DIAGNOSIS — T63441D Toxic effect of venom of bees, accidental (unintentional), subsequent encounter: Secondary | ICD-10-CM | POA: Diagnosis not present

## 2018-10-16 ENCOUNTER — Telehealth: Payer: Self-pay | Admitting: Allergy and Immunology

## 2018-10-16 MED ORDER — EPINEPHRINE 0.3 MG/0.3ML IJ SOAJ: INTRAMUSCULAR | 0 refills | Status: DC

## 2018-10-16 NOTE — Telephone Encounter (Signed)
Refill sent in called patient this was sent and appt made for follow up 11/07/2018 same day as venom shot

## 2018-10-16 NOTE — Telephone Encounter (Signed)
Requesting an Epi Pen. [facility] on [address].

## 2018-11-07 ENCOUNTER — Ambulatory Visit: Payer: Self-pay | Admitting: *Deleted

## 2018-11-07 ENCOUNTER — Encounter: Payer: Self-pay | Admitting: Allergy and Immunology

## 2018-11-07 ENCOUNTER — Ambulatory Visit (INDEPENDENT_AMBULATORY_CARE_PROVIDER_SITE_OTHER): Payer: No Typology Code available for payment source | Admitting: Allergy and Immunology

## 2018-11-07 ENCOUNTER — Other Ambulatory Visit: Payer: Self-pay

## 2018-11-07 VITALS — BP 124/72 | HR 68 | Temp 98.3°F | Resp 16 | Ht 64.0 in | Wt 97.0 lb

## 2018-11-07 DIAGNOSIS — T63441D Toxic effect of venom of bees, accidental (unintentional), subsequent encounter: Secondary | ICD-10-CM | POA: Diagnosis not present

## 2018-11-07 NOTE — Progress Notes (Signed)
[facility] [place]   Follow-up Note  Referring Provider: Elby Showers, MD Primary Provider: Elby Showers, MD Date of Office Visit: 11/07/2018  Subjective:   Tammy Shields, Tammy Shields (DOB: 03-Apr-1956) is a 63 y.o. 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  Her last visit to this clinic was 02 November 2016.  She continues on immunotherapy every 8 weeks without any adverse effect.  She has not been stung in the field by flying hymenoptera.  She does have an injectable epinephrine device.  Allergies as of 11/07/2018      Reactions   Bee Venom Anaphylaxis   MIXED VESPID, HONEY BEE      Medication List      Auvi-Q 0.3 mg/0.3 mL Soaj injection Generic drug:  EPINEPHrine Use as directed for life-threatening allergic reaction.   EPINEPHrine 0.3 mg/0.3 mL Soaj injection Commonly known as:  EpiPen 2-Pak Use as directed for severe allergic reaction   Centrum Silver Adult 50+ Tabs Take 1 tablet by mouth daily.   CURCUMIN 95 PO Take 400 mg by mouth daily.   FISH OIL PO Take 360 mg by mouth daily.   ibuprofen 200 MG tablet Commonly known as:  ADVIL,MOTRIN Take 400 mg by mouth every 6 (six) hours as needed for mild pain. Hs prn   magnesium oxide 400 MG tablet Commonly known as:  MAG-OX Take 400 mg by mouth daily.   NON FORMULARY Ca(Life Extension Bone Restore with Vit K2)  Take 3 capsules po once daily.   VITAMIN B12 PO Take 300 mcg by mouth daily.   vitamin C 500 MG tablet Commonly known as:  ASCORBIC ACID Take 500 mg by mouth daily.   Vitamin D 1000 units capsule Take 2,000 Units by mouth daily.       Past Medical History:  Diagnosis Date  . Basal cell carcinoma 2008  . Hymenoptera allergy   .  Osteoporosis   . Postmenopausal   . Raynaud's disease     Past Surgical History:  Procedure Laterality Date  . BIOPSY ENDOMETRIAL  2006  . BREAST CYST ASPIRATION  12/02/1995    Review of systems negative except as noted in HPI / PMHx or noted below:  Review of Systems  Constitutional: Negative.   HENT: Negative.   Eyes: Negative.   Respiratory: Negative.   Cardiovascular: Negative.   Gastrointestinal: Negative.   Genitourinary: Negative.   Musculoskeletal: Negative.   Skin: Negative.   Neurological: Negative.   Endo/Heme/Allergies: Negative.   Psychiatric/Behavioral: Negative.      Objective:   Vitals:   11/07/18 1626  BP: 124/72  Pulse: 68  Resp: 16  Temp: 98.3 F (36.8 C)  SpO2: 98%   Height: 5\' 4"  (162.6 cm)  Weight: 97 lb (44 kg)   Physical Exam Constitutional:      Appearance: She is not diaphoretic.  HENT:     Head: Normocephalic.     Right Ear: Tympanic membrane, ear canal and external ear normal.     Left Ear: Tympanic membrane, ear canal and external ear normal.     Nose: Nose normal. No mucosal edema or rhinorrhea.     Mouth/Throat:     Pharynx: Uvula midline. No oropharyngeal exudate.  Eyes:     Conjunctiva/sclera:  Conjunctivae normal.  Neck:     Thyroid: No thyromegaly.     Trachea: Trachea normal. No tracheal tenderness or tracheal deviation.  Cardiovascular:     Rate and Rhythm: Normal rate and regular rhythm.     Heart sounds: Normal heart sounds, S1 normal and S2 normal. No murmur.  Pulmonary:     Effort: No respiratory distress.     Breath sounds: Normal breath sounds. No stridor. No wheezing or rales.  Lymphadenopathy:     Head:     Right side of head: No tonsillar adenopathy.     Left side of head: No tonsillar adenopathy.     Cervical: No cervical adenopathy.  Skin:    Findings: No erythema or rash.     Nails: There is no clubbing.   Neurological:     Mental Status: She is alert.     Diagnostics: none  Assessment and  Plan:   1. Toxic effect of venom of bees, unintentional, subsequent encounter     1.  Continue immunotherapy directed against mixed vespid and wasp  2.  Continue EpiPen/Auvi-Q if needed  3.  Return to clinic in 1 year or earlier if problem  Tammy Shields is doing quite well on immunotherapy and she will continue this form of treatment every 8 weeks and I will see her back in this clinic in 1 year or earlier if there is a problem.  Tammy Katz, MD Allergy / Immunology [place]

## 2018-11-07 NOTE — Patient Instructions (Signed)
  1.  Continue immunotherapy directed against mixed vespid and wasp  2.  Continue EpiPen/Auvi-Q if needed  3.  Return to clinic in 1 year or earlier if problem

## 2018-11-08 ENCOUNTER — Encounter: Payer: Self-pay | Admitting: Allergy and Immunology

## 2019-01-02 ENCOUNTER — Other Ambulatory Visit: Payer: Self-pay

## 2019-01-02 ENCOUNTER — Ambulatory Visit: Payer: Self-pay

## 2019-01-02 ENCOUNTER — Ambulatory Visit (INDEPENDENT_AMBULATORY_CARE_PROVIDER_SITE_OTHER): Payer: PPO | Admitting: *Deleted

## 2019-01-02 DIAGNOSIS — T63441D Toxic effect of venom of bees, accidental (unintentional), subsequent encounter: Secondary | ICD-10-CM | POA: Diagnosis not present

## 2019-01-24 ENCOUNTER — Other Ambulatory Visit: Payer: Self-pay | Admitting: Plastic Surgery

## 2019-01-24 DIAGNOSIS — D485 Neoplasm of uncertain behavior of skin: Secondary | ICD-10-CM | POA: Diagnosis not present

## 2019-01-24 DIAGNOSIS — D1809 Hemangioma of other sites: Secondary | ICD-10-CM | POA: Diagnosis not present

## 2019-02-19 ENCOUNTER — Telehealth: Payer: Self-pay | Admitting: Obstetrics & Gynecology

## 2019-02-19 NOTE — Telephone Encounter (Signed)
Patient would like to come in before her aex for routine lab work. Patient scheduled 02/27/19 will need orders. No need to call patient unless you have questions.

## 2019-02-19 NOTE — Telephone Encounter (Signed)
Last AEX 01/06/18, no lab work obtained or on file.  Next AEX 03/30/19  Scheduled for labs 02/27/19  Dr. Sabra Heck -please review and advise on lab orders.

## 2019-02-20 ENCOUNTER — Other Ambulatory Visit: Payer: Self-pay | Admitting: Obstetrics & Gynecology

## 2019-02-20 DIAGNOSIS — Z0000 Encounter for general adult medical examination without abnormal findings: Secondary | ICD-10-CM

## 2019-02-20 NOTE — Telephone Encounter (Signed)
Lab appt notes updated.   Encounter closed.

## 2019-02-20 NOTE — Telephone Encounter (Signed)
Orders placed for lab work for CBC, CMP, Lipids, TSH, and Vit D.  Last blood work like this was done in 2017.  Ok to close encounter.

## 2019-02-26 ENCOUNTER — Telehealth: Payer: Self-pay | Admitting: Obstetrics & Gynecology

## 2019-02-26 NOTE — Telephone Encounter (Signed)
Patient canceled her upcoming lab appointment. She will cal later to reschedule.

## 2019-02-26 NOTE — Telephone Encounter (Signed)
Patient rescheduled 03/01/19.

## 2019-02-27 ENCOUNTER — Other Ambulatory Visit: Payer: PPO

## 2019-02-27 ENCOUNTER — Ambulatory Visit: Payer: PPO

## 2019-03-01 ENCOUNTER — Other Ambulatory Visit (INDEPENDENT_AMBULATORY_CARE_PROVIDER_SITE_OTHER): Payer: PPO

## 2019-03-01 ENCOUNTER — Other Ambulatory Visit: Payer: Self-pay

## 2019-03-01 DIAGNOSIS — Z0000 Encounter for general adult medical examination without abnormal findings: Secondary | ICD-10-CM | POA: Diagnosis not present

## 2019-03-01 DIAGNOSIS — D708 Other neutropenia: Secondary | ICD-10-CM

## 2019-03-02 ENCOUNTER — Ambulatory Visit (INDEPENDENT_AMBULATORY_CARE_PROVIDER_SITE_OTHER): Payer: PPO | Admitting: *Deleted

## 2019-03-02 DIAGNOSIS — T63441D Toxic effect of venom of bees, accidental (unintentional), subsequent encounter: Secondary | ICD-10-CM | POA: Diagnosis not present

## 2019-03-02 LAB — CBC
Hematocrit: 42.2 % (ref 34.0–46.6)
Hemoglobin: 13.5 g/dL (ref 11.1–15.9)
MCH: 31.7 pg (ref 26.6–33.0)
MCHC: 32 g/dL (ref 31.5–35.7)
MCV: 99 fL — ABNORMAL HIGH (ref 79–97)
Platelets: 149 10*3/uL — ABNORMAL LOW (ref 150–450)
RBC: 4.26 x10E6/uL (ref 3.77–5.28)
RDW: 13.2 % (ref 11.7–15.4)
WBC: 2.9 10*3/uL — ABNORMAL LOW (ref 3.4–10.8)

## 2019-03-02 LAB — COMPREHENSIVE METABOLIC PANEL
ALT: 18 IU/L (ref 0–32)
AST: 23 IU/L (ref 0–40)
Albumin/Globulin Ratio: 2.6 — ABNORMAL HIGH (ref 1.2–2.2)
Albumin: 4.4 g/dL (ref 3.8–4.8)
Alkaline Phosphatase: 70 IU/L (ref 39–117)
BUN/Creatinine Ratio: 30 — ABNORMAL HIGH (ref 12–28)
BUN: 21 mg/dL (ref 8–27)
Bilirubin Total: 0.2 mg/dL (ref 0.0–1.2)
CO2: 28 mmol/L (ref 20–29)
Calcium: 9.3 mg/dL (ref 8.7–10.3)
Chloride: 100 mmol/L (ref 96–106)
Creatinine, Ser: 0.71 mg/dL (ref 0.57–1.00)
GFR calc Af Amer: 103 mL/min/{1.73_m2} (ref >59)
GFR calc non Af Amer: 90 mL/min/{1.73_m2} (ref >59)
Globulin, Total: 1.7 g/dL (ref 1.5–4.5)
Glucose: 98 mg/dL (ref 65–99)
Potassium: 4.2 mmol/L (ref 3.5–5.2)
Sodium: 141 mmol/L (ref 134–144)
Total Protein: 6.1 g/dL (ref 6.0–8.5)

## 2019-03-02 LAB — LIPID PANEL
Chol/HDL Ratio: 2.1 ratio (ref 0.0–4.4)
Cholesterol, Total: 219 mg/dL — ABNORMAL HIGH (ref 100–199)
HDL: 105 mg/dL (ref >39)
LDL Calculated: 104 mg/dL — ABNORMAL HIGH (ref 0–99)
Triglycerides: 49 mg/dL (ref 0–149)
VLDL Cholesterol Cal: 10 mg/dL (ref 5–40)

## 2019-03-02 LAB — VITAMIN D 25 HYDROXY (VIT D DEFICIENCY, FRACTURES): Vit D, 25-Hydroxy: 35.5 ng/mL (ref 30.0–100.0)

## 2019-03-02 LAB — TSH: TSH: 1.18 u[IU]/mL (ref 0.450–4.500)

## 2019-03-06 ENCOUNTER — Encounter: Payer: Self-pay | Admitting: *Deleted

## 2019-03-28 ENCOUNTER — Other Ambulatory Visit: Payer: Self-pay

## 2019-03-30 ENCOUNTER — Encounter: Payer: Self-pay | Admitting: Obstetrics & Gynecology

## 2019-03-30 ENCOUNTER — Other Ambulatory Visit: Payer: Self-pay

## 2019-03-30 ENCOUNTER — Ambulatory Visit (INDEPENDENT_AMBULATORY_CARE_PROVIDER_SITE_OTHER): Payer: PPO | Admitting: Obstetrics & Gynecology

## 2019-03-30 VITALS — BP 128/80 | HR 72 | Temp 98.2°F | Ht 63.75 in | Wt 95.6 lb

## 2019-03-30 DIAGNOSIS — Z01419 Encounter for gynecological examination (general) (routine) without abnormal findings: Secondary | ICD-10-CM | POA: Diagnosis not present

## 2019-03-30 NOTE — Progress Notes (Signed)
63 y.o. G0P0000 [name] here for annual exam.  Doing well.  Does get allergy shots every two months due to allergies with bee stings.    Denies vaginal bleeding.  Had shoulder injury last November.  Was on anti-inflammatories.    Did see Dr. Harlow Mares this summer due to reddish lesion.  Punch biopsy showed hemangioma.    No LMP recorded. Patient is postmenopausal.          Sexually active: No.  The current method of family planning is post menopausal status.    Exercising: Yes.    elliptical, walk, yoga, aerobics Smoker:  no  Health Maintenance: Pap:  10/08/16 Neg. HR HPV:neg   12/08/12 Neg  History of abnormal Pap:  no MMG:  10/29/16 BIRADS1:neg  Colonoscopy:  09/07/13 polyps. F/u 5 years.  She is aware but not sure she wants to have this done this year.   BMD:   01/10/15 Osteoporosis.  Has declined repeating. TDaP:  2012 Pneumonia vaccine(s):  Reviewed guidelines with pt. Shingrix:   No Hep C testing: 10/08/16 Neg  Screening Labs: done    reports that she has never smoked. She has never used smokeless tobacco. She reports current alcohol use of about 8.0 - 10.0 standard drinks of alcohol per week. She reports that she does not use drugs.  Past Medical History:  Diagnosis Date  . Basal cell carcinoma 2008  . Bulging of cervical intervertebral disc   . Carpal tunnel syndrome on right   . Finger numbness   . Hymenoptera allergy   . Osteoporosis   . Postmenopausal   . Raynaud's disease     Past Surgical History:  Procedure Laterality Date  . BIOPSY ENDOMETRIAL  2006  . BREAST CYST ASPIRATION  12/02/1995    Current Outpatient Medications  Medication Sig Dispense Refill  . b complex vitamins tablet Take 1 tablet by mouth daily.    . Cholecalciferol (VITAMIN D) 1000 UNITS capsule Take 2,000 Units by mouth daily.     Marland Kitchen EPINEPHrine (EPIPEN 2-PAK) 0.3 mg/0.3 mL IJ SOAJ injection Use as directed for severe allergic reaction 2 Device 0  . magnesium oxide  (MAG-OX) 400 MG tablet Take 400 mg by mouth daily.    . Multiple Vitamins-Minerals (CENTRUM SILVER ADULT 50+) TABS Take 1 tablet by mouth daily.    . NON FORMULARY Ca(Life Extension Bone Restore with Vit K2)  Take 3 capsules po once daily.    . vitamin C (ASCORBIC ACID) 500 MG tablet Take 500 mg by mouth daily.    Marland Kitchen ibuprofen (ADVIL,MOTRIN) 200 MG tablet Take 400 mg by mouth every 6 (six) hours as needed for mild pain. Hs prn      No current facility-administered medications for this visit.     Family History  Problem Relation Age of Onset  . Heart disease Mother   . Diabetes Mother   . Lung cancer Mother   . Hypertension Mother   . Prostate cancer Father   . Hypertension Sister   . Diabetes Brother     Review of Systems  All other systems reviewed and are negative.   Exam:   BP 128/80   Pulse 72   Temp 98.2 F (36.8 C) (Temporal)   Ht 5' 3.75" (1.619 m)   Wt 95 lb 9.6 oz (43.4 kg)   BMI 16.54 kg/m   Height: 5' 3.75" (161.9 cm)  Ht Readings from Last 3 Encounters:  03/30/19 5' 3.75" (1.619 m)  11/07/18  5\' 4"  (1.626 m)  06/30/18 5\' 4"  (1.626 m)    General appearance: alert, cooperative and appears stated age Head: Normocephalic, without obvious abnormality, atraumatic Neck: no adenopathy, supple, symmetrical, trachea midline and thyroid normal to inspection and palpation Lungs: clear to auscultation bilaterally Breasts: normal appearance, no masses or tenderness Heart: regular rate and rhythm Abdomen: soft, non-tender; bowel sounds normal; no masses,  no organomegaly Extremities: extremities normal, atraumatic, no cyanosis or edema Skin: Skin color, texture, turgor normal. No rashes or lesions Lymph nodes: Cervical, supraclavicular, and axillary nodes normal. No abnormal inguinal nodes palpated Neurologic: Grossly normal   Pelvic: External genitalia:  no lesions              Urethra:  normal appearing urethra with no masses, tenderness or lesions               Bartholins and Skenes: normal                 Vagina: normal appearing vagina with normal color and discharge, no lesions              Cervix: no lesions              Pap taken: No. Bimanual Exam:  Uterus:  normal size, contour, position, consistency, mobility, non-tender              Adnexa: normal adnexa and no mass, fullness, tenderness               Rectovaginal: Confirms               Anus:  normal sphincter tone, no lesions  Chaperone was present for exam.  A:  Well Woman with normal exam PMP, no HRT Osteoporosis, has declined treatement at this time Bee sting allergy Mild thrombocytopenia  P:   Mammogram guidelines reviewed.  Pt aware this is due. pap smear with neg HR HPV 2018.  Not indicated today. Colonoscopy is due.  States she is not sure if she will do it this year BMD discussed.  She will consider repeating this in a year or two. Rx for prevnar given today.  Due pneumovax in >60 months Repeat CBC in 3 months return annually or prn

## 2019-04-27 ENCOUNTER — Ambulatory Visit: Payer: Self-pay

## 2019-05-03 ENCOUNTER — Ambulatory Visit (INDEPENDENT_AMBULATORY_CARE_PROVIDER_SITE_OTHER): Payer: PPO | Admitting: *Deleted

## 2019-05-03 ENCOUNTER — Other Ambulatory Visit: Payer: Self-pay

## 2019-05-03 DIAGNOSIS — T63441D Toxic effect of venom of bees, accidental (unintentional), subsequent encounter: Secondary | ICD-10-CM | POA: Diagnosis not present

## 2019-06-01 ENCOUNTER — Other Ambulatory Visit: Payer: Self-pay | Admitting: Plastic Surgery

## 2019-06-01 DIAGNOSIS — D485 Neoplasm of uncertain behavior of skin: Secondary | ICD-10-CM | POA: Diagnosis not present

## 2019-06-01 DIAGNOSIS — D1801 Hemangioma of skin and subcutaneous tissue: Secondary | ICD-10-CM | POA: Diagnosis not present

## 2019-06-05 ENCOUNTER — Other Ambulatory Visit: Payer: PPO

## 2019-06-26 ENCOUNTER — Other Ambulatory Visit: Payer: PPO | Admitting: Internal Medicine

## 2019-06-26 ENCOUNTER — Other Ambulatory Visit: Payer: Self-pay

## 2019-06-26 DIAGNOSIS — R7302 Impaired glucose tolerance (oral): Secondary | ICD-10-CM

## 2019-06-26 DIAGNOSIS — Z0000 Encounter for general adult medical examination without abnormal findings: Secondary | ICD-10-CM

## 2019-06-26 DIAGNOSIS — F439 Reaction to severe stress, unspecified: Secondary | ICD-10-CM | POA: Diagnosis not present

## 2019-06-26 DIAGNOSIS — E7800 Pure hypercholesterolemia, unspecified: Secondary | ICD-10-CM

## 2019-06-26 DIAGNOSIS — K635 Polyp of colon: Secondary | ICD-10-CM

## 2019-06-26 DIAGNOSIS — M791 Myalgia, unspecified site: Secondary | ICD-10-CM

## 2019-06-28 ENCOUNTER — Ambulatory Visit (INDEPENDENT_AMBULATORY_CARE_PROVIDER_SITE_OTHER): Payer: PPO | Admitting: *Deleted

## 2019-06-28 ENCOUNTER — Other Ambulatory Visit: Payer: Self-pay | Admitting: Internal Medicine

## 2019-06-28 ENCOUNTER — Other Ambulatory Visit: Payer: Self-pay

## 2019-06-28 DIAGNOSIS — D72819 Decreased white blood cell count, unspecified: Secondary | ICD-10-CM

## 2019-06-28 DIAGNOSIS — T63441D Toxic effect of venom of bees, accidental (unintentional), subsequent encounter: Secondary | ICD-10-CM | POA: Diagnosis not present

## 2019-06-29 ENCOUNTER — Encounter: Payer: Self-pay | Admitting: Internal Medicine

## 2019-06-29 ENCOUNTER — Ambulatory Visit (INDEPENDENT_AMBULATORY_CARE_PROVIDER_SITE_OTHER): Payer: PPO | Admitting: Internal Medicine

## 2019-06-29 ENCOUNTER — Other Ambulatory Visit: Payer: Self-pay

## 2019-06-29 VITALS — BP 118/60 | HR 65 | Ht 63.75 in | Wt 95.0 lb

## 2019-06-29 DIAGNOSIS — Z0000 Encounter for general adult medical examination without abnormal findings: Secondary | ICD-10-CM | POA: Diagnosis not present

## 2019-06-29 DIAGNOSIS — K6289 Other specified diseases of anus and rectum: Secondary | ICD-10-CM

## 2019-06-29 DIAGNOSIS — M810 Age-related osteoporosis without current pathological fracture: Secondary | ICD-10-CM | POA: Diagnosis not present

## 2019-06-29 DIAGNOSIS — Z681 Body mass index (BMI) 19 or less, adult: Secondary | ICD-10-CM

## 2019-06-29 DIAGNOSIS — D72819 Decreased white blood cell count, unspecified: Secondary | ICD-10-CM

## 2019-06-29 DIAGNOSIS — D708 Other neutropenia: Secondary | ICD-10-CM | POA: Diagnosis not present

## 2019-06-29 LAB — HEMOGLOBIN A1C
Hgb A1c MFr Bld: 5.5 % of total Hgb (ref <5.7)
Mean Plasma Glucose: 111 (calc)
eAG (mmol/L): 6.2 (calc)

## 2019-06-29 LAB — COMPLETE METABOLIC PANEL WITH GFR
AG Ratio: 2.1 (calc) (ref 1.0–2.5)
ALT: 19 U/L (ref 6–29)
AST: 27 U/L (ref 10–35)
Albumin: 4.5 g/dL (ref 3.6–5.1)
Alkaline phosphatase (APISO): 66 U/L (ref 37–153)
BUN: 17 mg/dL (ref 7–25)
CO2: 31 mmol/L (ref 20–32)
Calcium: 9.6 mg/dL (ref 8.6–10.4)
Chloride: 103 mmol/L (ref 98–110)
Creat: 0.8 mg/dL (ref 0.50–0.99)
GFR, Est African American: 90 mL/min/{1.73_m2} (ref >=60)
GFR, Est Non African American: 77 mL/min/{1.73_m2} (ref >=60)
Globulin: 2.1 g/dL (calc) (ref 1.9–3.7)
Glucose, Bld: 92 mg/dL (ref 65–99)
Potassium: 4.4 mmol/L (ref 3.5–5.3)
Sodium: 141 mmol/L (ref 135–146)
Total Bilirubin: 0.4 mg/dL (ref 0.2–1.2)
Total Protein: 6.6 g/dL (ref 6.1–8.1)

## 2019-06-29 LAB — TEST AUTHORIZATION: TEST CODE:: 833

## 2019-06-29 LAB — CBC WITH DIFFERENTIAL/PLATELET
Absolute Monocytes: 170 cells/uL — ABNORMAL LOW (ref 200–950)
Basophils Absolute: 50 cells/uL (ref 0–200)
Basophils Relative: 2 %
Eosinophils Absolute: 40 cells/uL (ref 15–500)
Eosinophils Relative: 1.6 %
HCT: 40.2 % (ref 35.0–45.0)
Hemoglobin: 13.5 g/dL (ref 11.7–15.5)
Lymphs Abs: 950 cells/uL (ref 850–3900)
MCH: 32.1 pg (ref 27.0–33.0)
MCHC: 33.6 g/dL (ref 32.0–36.0)
MCV: 95.5 fL (ref 80.0–100.0)
MPV: 11 fL (ref 7.5–12.5)
Monocytes Relative: 6.8 %
Neutro Abs: 1290 cells/uL — ABNORMAL LOW (ref 1500–7800)
Neutrophils Relative %: 51.6 %
Platelets: 160 10*3/uL (ref 140–400)
RBC: 4.21 10*6/uL (ref 3.80–5.10)
RDW: 12.6 % (ref 11.0–15.0)
Total Lymphocyte: 38 %
WBC: 2.5 10*3/uL — ABNORMAL LOW (ref 3.8–10.8)

## 2019-06-29 LAB — PATHOLOGIST SMEAR REVIEW

## 2019-06-29 LAB — POCT URINALYSIS DIPSTICK
Appearance: NEGATIVE
Bilirubin, UA: NEGATIVE
Blood, UA: NEGATIVE
Glucose, UA: NEGATIVE
Ketones, UA: NEGATIVE
Leukocytes, UA: NEGATIVE
Nitrite, UA: NEGATIVE
Odor: NEGATIVE
Protein, UA: NEGATIVE
Spec Grav, UA: 1.01 (ref 1.010–1.025)
Urobilinogen, UA: 0.2 E.U./dL
pH, UA: 6.5 (ref 5.0–8.0)

## 2019-06-29 LAB — LIPID PANEL
Cholesterol: 212 mg/dL — ABNORMAL HIGH (ref <200)
HDL: 95 mg/dL (ref >=50)
LDL Cholesterol (Calc): 103 mg/dL (calc) — ABNORMAL HIGH
Non-HDL Cholesterol (Calc): 117 mg/dL (calc) (ref <130)
Total CHOL/HDL Ratio: 2.2 (calc) (ref <5.0)
Triglycerides: 46 mg/dL (ref <150)

## 2019-06-29 MED ORDER — CLOTRIMAZOLE-BETAMETHASONE 1-0.05 % EX CREA: 1.0000 "application " | TOPICAL_CREAM | Freq: Two times a day (BID) | CUTANEOUS | 3 refills | Status: DC

## 2019-06-29 NOTE — Progress Notes (Signed)
Subjective:    Patient ID: Tammy Shields, DDS, female    DOB: Jan 22, 1956, 63 y.o.   MRN: [id number]  HPI 63 year old Female seen in person for health maintenance exam and evaluation of medical issues.  Patient is now on Medicare.  This is her Welcome to Medicare physical exam.  EKG was performed and within normal limits.  She is a retired Pharmacist, community.  Husband is a retired Chief Financial Officer.  She is enjoying retirement.  Used to go to gym a lot but she also been closed and she has been exercising on her own.  Feeling well.  She saw Dr. Harlow Mares in July for small lesion right upper arm.  Punch biopsy was obtained.  Pathology showed this to be a hemangioma.  Labs reviewed.  She has total cholesterol of 212 with a very high HDL of 95 and an LDL of 103.  This is very acceptable.  She takes ibuprofen, takes vitamins including vitamin D.  She has an EpiPen because of history of insect sting allergy.  She is undergoing desensitization by allergist.  She has a history of osteopenia.  History of torn labrum left shoulder in 2006.  She has a longstanding history of leukopenia dating back to 2007.  See office visit note April 02, 2011.  History of Raynaud's phenomenon.  Family history: Father died at age 64 with prostate cancer.  Mother died at age 14 of cardiac arrest.  Mother with history of diabetes hypertension and cancer.  1 brother with diabetes.  1 sister in good health.  She did take Fosamax for osteopenia for a period of time but discontinued it.  History of bruxism.  Her white blood cell count on December 1 was 2500.  It was repeated December 18 and is 2700.  Pathology review of smear shows absolute neutropenia.  These are mature segmented neutrophils with mild reactive changes and a few reactive lymphocytes.  No immature cells identified.  We will continue to follow.    Review of Systems is having some irritation/itching in the perirectal area and Lotrisone cream will be prescribed to use  twice daily.     Objective:   Physical Exam Blood pressure 118/60 pulse 65 BMI 16.43 weight 95 pounds. Skin warm and dry.  Nodes none.  TMs and pharynx are clear.  Neck is supple.  Chest clear to auscultation.  Breast without masses.  Cardiac exam regular rate and rhythm normal S1 and S2.  Abdomen no hepatosplenomegaly masses or tenderness.  Extremities without deformity.  Perirectal area slight redness but no rash.  Neuro no focal deficits.  Thought process, judgment, and affect normal.      Assessment & Plan:  History longstanding of benign neutropenia with recent pathology review of smear confirming this.  Continue to monitor.  Perirectal itching/irritation have prescribed Lotrisone cream  BMI 16.43-reminded patient to get plenty of protein and calories and do not overexercise  History of osteoporosis and previously took Fosamax.  Last bone density study 2016.  This has been ordered for March 2021.  Last T score in the LS spine in 2016 was -3.4 and left femur was -2.8.  She would be a candidate for Prolia but we will wait and see what the results show in March.  Subjective:   Patient presents for Medicare Annual/Subsequent preventive examination.  Review Past Medical/Family/Social: See above   Risk Factors  Current exercise habits: Exercises alot Dietary issues discussed: Yes.  Low BMI.  Cardiac risk factors: None  Depression Screen  (Note: if answer to either of the following is "Yes", a more complete depression screening is indicated)   Over the past two weeks, have you felt down, depressed or hopeless? No  Over the past two weeks, have you felt little interest or pleasure in doing things? No Have you lost interest or pleasure in daily life? No Do you often feel hopeless? No Do you cry easily over simple problems? No   Activities of Daily Living  In your present state of health, do you have any difficulty performing the following activities?:   Driving? No  Managing  money? No  Feeding yourself? No  Getting from bed to chair? No  Climbing a flight of stairs? No  Preparing food and eating?: No  Bathing or showering? No  Getting dressed: No  Getting to the toilet? No  Using the toilet:No  Moving around from place to place: No  In the past year have you fallen or had a near fall?:No  Are you sexually active? No  Do you have more than one partner? No   Hearing Difficulties: No  Do you often ask people to speak up or repeat themselves? No  Do you experience ringing or noises in your ears? No  Do you have difficulty understanding soft or whispered voices? No  Do you feel that you have a problem with memory? No Do you often misplace items? No    Home Safety:  Do you have a smoke alarm at your residence? Yes Do you have grab bars in the bathroom?  Yes Do you have throw rugs in your house?  Yes   Cognitive Testing  Alert? Yes Normal Appearance?Yes  Oriented to person? Yes Place? Yes  Time? Yes  Recall of three objects? Yes  Can perform simple calculations? Yes  Displays appropriate judgment?Yes  Can read the correct time from a watch face?Yes   List the Names of Other Physician/Practitioners you currently use:  See referral list for the physicians patient is currently seeing.   Dr. Sabra Heck is GYN physician  Review of Systems: See above   Objective:     General appearance: Appears younger than stated age and thin Head: Normocephalic, without obvious abnormality, atraumatic  Eyes: conj clear, EOMi PEERLA  Ears: normal TM's and external ear canals both ears  Nose: Nares normal. Septum midline. Mucosa normal. No drainage or sinus tenderness.  Throat: lips, mucosa, and tongue normal; teeth and gums normal  Neck: no adenopathy, no carotid bruit, no JVD, supple, symmetrical, trachea midline and thyroid not enlarged, symmetric, no tenderness/mass/nodules  No CVA tenderness.  Lungs: clear to auscultation bilaterally  Breasts: normal  appearance, no masses or tenderness, Heart: regular rate and rhythm, S1, S2 normal, no murmur, click, rub or gallop  Abdomen: soft, non-tender; bowel sounds normal; no masses, no organomegaly  Musculoskeletal: ROM normal in all joints, no crepitus, no deformity, Normal muscle strengthen. Back  is symmetric, no curvature. Skin: Skin color, texture, turgor normal. No rashes or lesions  Lymph nodes: Cervical, supraclavicular, and axillary nodes normal.  Neurologic: CN 2 -12 Normal, Normal symmetric reflexes. Normal coordination and gait  Psych: Alert & Oriented x 3, Mood appear stable.    Assessment:    Annual wellness medicare exam   Plan:    During the course of the visit the patient was educated and counseled about appropriate screening and preventive services including:   Tetanus immunization is up-to-date.  Recommend flu vaccine and pneumococcal vaccines  Also recommend  Shingrix vaccine  To have mammogram and bone density study in March     Patient Instructions (the written plan) was given to the patient.  Medicare Attestation  I have personally reviewed:  [name] medical and social history  Their use of alcohol, tobacco or illicit drugs  Their current medications and supplements  [name] functional ability including ADLs,fall risks, home safety risks, cognitive, and hearing and visual impairment  Diet and physical activities  Evidence for depression or mood disorders  [name] weight, height, BMI, and visual acuity have been recorded in the chart. I have made referrals, counseling, and provided education to the patient based on review of the above and I have provided the patient with a written personalized care plan for preventive services.

## 2019-07-02 ENCOUNTER — Telehealth: Payer: Self-pay | Admitting: Internal Medicine

## 2019-07-02 DIAGNOSIS — M810 Age-related osteoporosis without current pathological fracture: Secondary | ICD-10-CM

## 2019-07-02 DIAGNOSIS — Z1231 Encounter for screening mammogram for malignant neoplasm of breast: Secondary | ICD-10-CM

## 2019-07-02 NOTE — Telephone Encounter (Signed)
Left message orders are in.

## 2019-07-02 NOTE — Telephone Encounter (Signed)
Dr Ennis Forts called to say [facility]. She called to schedule and they said she needed and order.

## 2019-07-13 ENCOUNTER — Other Ambulatory Visit: Payer: PPO | Admitting: Internal Medicine

## 2019-07-13 ENCOUNTER — Other Ambulatory Visit: Payer: Self-pay

## 2019-07-13 DIAGNOSIS — D72819 Decreased white blood cell count, unspecified: Secondary | ICD-10-CM

## 2019-07-16 LAB — CBC WITH DIFFERENTIAL/PLATELET
Absolute Monocytes: 232 cells/uL (ref 200–950)
Basophils Absolute: 41 cells/uL (ref 0–200)
Basophils Relative: 1.5 %
Eosinophils Absolute: 59 cells/uL (ref 15–500)
Eosinophils Relative: 2.2 %
HCT: 38.3 % (ref 35.0–45.0)
Hemoglobin: 13 g/dL (ref 11.7–15.5)
Lymphs Abs: 940 cells/uL (ref 850–3900)
MCH: 32.3 pg (ref 27.0–33.0)
MCHC: 33.9 g/dL (ref 32.0–36.0)
MCV: 95.3 fL (ref 80.0–100.0)
MPV: 11.1 fL (ref 7.5–12.5)
Monocytes Relative: 8.6 %
Neutro Abs: 1428 cells/uL — ABNORMAL LOW (ref 1500–7800)
Neutrophils Relative %: 52.9 %
Platelets: 179 10*3/uL (ref 140–400)
RBC: 4.02 10*6/uL (ref 3.80–5.10)
RDW: 12.3 % (ref 11.0–15.0)
Total Lymphocyte: 34.8 %
WBC: 2.7 10*3/uL — ABNORMAL LOW (ref 3.8–10.8)

## 2019-07-16 LAB — PATHOLOGIST SMEAR REVIEW

## 2019-07-22 NOTE — Patient Instructions (Signed)
Make sure you are getting enough calories to support the amount of exercise you are doing.  We will have pathologist review blood smear due to longstanding history of low white blood cell count.  Have mammogram and bone density study in March.  It was a pleasure to see you today.  Use Lotrisone cream in perirectal area.

## 2019-07-31 ENCOUNTER — Telehealth: Payer: Self-pay | Admitting: Internal Medicine

## 2019-07-31 NOTE — Telephone Encounter (Addendum)
Spoke with patient. She saw Dr. Alen Blew  in 2013 about same issue. Please put  in Hematology referral for Dr Alen Blew to see her regarding leukopenia just to make sure we are not missing something.

## 2019-07-31 NOTE — Telephone Encounter (Signed)
Patient called to see if she needs a office visit to discuss last labs, or if everything is okay

## 2019-07-31 NOTE — Telephone Encounter (Signed)
Referral placed.

## 2019-08-03 ENCOUNTER — Telehealth: Payer: Self-pay | Admitting: Oncology

## 2019-08-03 NOTE — Telephone Encounter (Signed)
I received a new hem referral from Dr. Renold Genta for Tammy Shields w/dx of neutropenia. Tammy Shields has been cld and scheduled to see Dr. Alen Blew on 1/19 at 11am. She last saw Dr. Alen Blew in 2013 and has been made aware to arrive 15 minutes early.

## 2019-08-14 ENCOUNTER — Other Ambulatory Visit: Payer: Self-pay

## 2019-08-14 ENCOUNTER — Inpatient Hospital Stay: Payer: PPO | Attending: Oncology | Admitting: Oncology

## 2019-08-14 VITALS — BP 114/68 | HR 57 | Temp 98.0°F | Resp 16 | Ht 63.75 in | Wt 96.9 lb

## 2019-08-14 DIAGNOSIS — D72819 Decreased white blood cell count, unspecified: Secondary | ICD-10-CM | POA: Insufficient documentation

## 2019-08-14 DIAGNOSIS — D709 Neutropenia, unspecified: Secondary | ICD-10-CM

## 2019-08-14 DIAGNOSIS — Z791 Long term (current) use of non-steroidal anti-inflammatories (NSAID): Secondary | ICD-10-CM | POA: Insufficient documentation

## 2019-08-14 NOTE — Progress Notes (Signed)
Hematology and Oncology Follow Up Visit  Tammy Shields, DDS [id number] 1955/08/11 64 y.o. 08/14/2019 10:35 AM    Principle Diagnosis: 64 year old woman with leukocytopenia noted in 2012. She had normal differential without any clear-cut hematological disorder diagnosed.   Current therapy: Active surveillance    Interim History: Dr. Ennis Forts returns today for a repeat evaluation. She is a 64 year old woman seen in consultation in the past for isolated leukocytopenia dating back to 2012. At that time her white cell count have ranged between 2.6 and 3.6 for the last 8 years. Her differential has been within normal range with neutrophil percentage around 50 to 60%. Absolute neutrophil count been close to 1500.  Since the last visit, she reports no major changes in her health.  She has retired as a Careers information officer and has not reported any new hospitalizations.  She denies any illnesses, fevers or chills.  She denies any painful adenopathy or constitutional symptoms.  Medications: I have reviewed the patient's current medications.  Current Outpatient Medications:  .  b complex vitamins tablet, Take 1 tablet by mouth daily., Disp: , Rfl:  .  Cholecalciferol (VITAMIN D) 1000 UNITS capsule, Take 2,000 Units by mouth daily. , Disp: , Rfl:  .  clotrimazole-betamethasone (LOTRISONE) cream, Apply 1 application topically 2 (two) times daily., Disp: 30 g, Rfl: 3 .  co-enzyme Q-10 30 MG capsule, Take 30 mg by mouth 3 (three) times daily., Disp: , Rfl:  .  EPINEPHrine (EPIPEN 2-PAK) 0.3 mg/0.3 mL IJ SOAJ injection, Use as directed for severe allergic reaction, Disp: 2 Device, Rfl: 0 .  ibuprofen (ADVIL,MOTRIN) 200 MG tablet, Take 400 mg by mouth every 6 (six) hours as needed for mild pain. Hs prn , Disp: , Rfl:  .  magnesium oxide (MAG-OX) 400 MG tablet, Take 400 mg by mouth daily., Disp: , Rfl:  .  Multiple Vitamins-Minerals (CENTRUM SILVER ADULT 50+) TABS, Take 1 tablet by mouth daily., Disp: , Rfl:  .   NON FORMULARY, Ca(Life Extension Bone Restore with Vit K2)  Take 3 capsules po once daily., Disp: , Rfl:  .  vitamin C (ASCORBIC ACID) 500 MG tablet, Take 500 mg by mouth daily., Disp: , Rfl:   Allergies:  Allergies  Allergen Reactions  . Bee Venom Anaphylaxis    MIXED VESPID, HONEY BEE       Physical Exam: Blood pressure 114/68, pulse (!) 57, temperature 98 F (36.7 C), temperature source Temporal, resp. rate 16, height 5' 3.75" (1.619 m), weight 96 lb 14.4 oz (44 kg), SpO2 100 %.   ECOG: 0   General appearance: Comfortable appearing without any discomfort Head: Normocephalic without any trauma Oropharynx: Mucous membranes are moist and pink without any thrush or ulcers. Eyes: Pupils are equal and round reactive to light. Lymph nodes: No cervical, supraclavicular, inguinal or axillary lymphadenopathy.   Heart:regular rate and rhythm.  S1 and S2 without leg edema. Lung: Clear without any rhonchi or wheezes.  No dullness to percussion. Abdomin: Soft, nontender, nondistended with good bowel sounds.  No hepatosplenomegaly. Musculoskeletal: No joint deformity or effusion.  Full range of motion noted. Neurological: No deficits noted on motor, sensory and deep tendon reflex exam. Skin: No petechial rash or dryness.  Appeared moist.     Lab Results: Lab Results  Component Value Date   WBC 2.7 (L) 07/13/2019   HGB 13.0 07/13/2019   HCT 38.3 07/13/2019   MCV 95.3 07/13/2019   PLT 179 07/13/2019     Chemistry  Component Value Date/Time   NA 141 06/26/2019 1001   NA 141 03/01/2019 0917   K 4.4 06/26/2019 1001   CL 103 06/26/2019 1001   CO2 31 06/26/2019 1001   BUN 17 06/26/2019 1001   BUN 21 03/01/2019 0917   CREATININE 0.80 06/26/2019 1001      Component Value Date/Time   CALCIUM 9.6 06/26/2019 1001   ALKPHOS 70 03/01/2019 0917   AST 27 06/26/2019 1001   ALT 19 06/26/2019 1001   BILITOT 0.4 06/26/2019 1001   BILITOT 0.2 03/01/2019 0917       Impression and  Plan:   64 year old woman with:  1. Leukocytopenia dating back to at least 2012 with that total white cell count ranges ranges between 2.5 and as high as 3.6. Her differential has been close to normal range with absolute neutrophil count around 1500. CBC obtained on July 13, 2019 was personally reviewed showed a white cell count of 2.7, hemoglobin of 13 and a platelet count of 179. Differential showed a neutrophil percentage of 52.9% and absolute neutrophil count of 1428.  The differential diagnosis was reviewed again with the patient. Laboratory data dating back to 2012 also reviewed and discussed. Potential bone marrow problem such as myelodysplastic syndrome, lymphoproliferative disorder were considered less likely at this time. Given the chronicity of these findings as well as normal differential in addition to a normal hemoglobin and platelet count makes the likelihood of a primary hematological condition very low.  Options of therapy at this time were reviewed. I see no need for growth factor support for treatment at this time. Obtaining a bone marrow biopsy at this time would likely be low yield given the marginal nature of her abnormalities. Continued active surveillance is recommended at this time with the caveat that we might need a bone marrow biopsy if she has other cell lines are abnormal. She develops worsening anemia or thrombocytopenia then consideration for bone marrow biopsy will be made.   After discussion today, we recommended proceeding with active surveillance only with periodic CBC monitoring.  She is getting her CBC done at least annually by Dr. Renold Genta.  I recommended continuing to do so.  Of other hematological abnormalities arise we can revisit this issue in the future.   2.  Follow-up: I am happy to see her in the future as needed.   30  minutes was spent on this encounter.  Time was dedicated to reviewing laboratory data, discussing differential diagnosis, management  options as well as answering questions regarding future plan of care.    Zola Button, MD 1/19/202110:35 AM

## 2019-08-23 ENCOUNTER — Ambulatory Visit (INDEPENDENT_AMBULATORY_CARE_PROVIDER_SITE_OTHER): Payer: PPO

## 2019-08-23 ENCOUNTER — Other Ambulatory Visit: Payer: Self-pay

## 2019-08-23 DIAGNOSIS — T63441D Toxic effect of venom of bees, accidental (unintentional), subsequent encounter: Secondary | ICD-10-CM

## 2019-09-25 ENCOUNTER — Other Ambulatory Visit: Payer: Self-pay

## 2019-09-25 ENCOUNTER — Ambulatory Visit
Admission: RE | Admit: 2019-09-25 | Discharge: 2019-09-25 | Disposition: A | Discharge status: Home / Self Care | Payer: No Typology Code available for payment source | Source: Ambulatory Visit | Attending: Internal Medicine | Admitting: Internal Medicine

## 2019-09-25 ENCOUNTER — Ambulatory Visit
Admission: RE | Admit: 2019-09-25 | Discharge: 2019-09-25 | Disposition: A | Discharge status: Home / Self Care | Payer: PPO | Source: Ambulatory Visit | Attending: Internal Medicine | Admitting: Internal Medicine

## 2019-09-25 DIAGNOSIS — Z780 Asymptomatic menopausal state: Secondary | ICD-10-CM | POA: Diagnosis not present

## 2019-09-25 DIAGNOSIS — Z1231 Encounter for screening mammogram for malignant neoplasm of breast: Secondary | ICD-10-CM | POA: Diagnosis not present

## 2019-09-25 DIAGNOSIS — M810 Age-related osteoporosis without current pathological fracture: Secondary | ICD-10-CM

## 2019-10-18 ENCOUNTER — Other Ambulatory Visit: Payer: Self-pay

## 2019-10-18 ENCOUNTER — Ambulatory Visit (INDEPENDENT_AMBULATORY_CARE_PROVIDER_SITE_OTHER): Payer: PPO

## 2019-10-18 DIAGNOSIS — T63441D Toxic effect of venom of bees, accidental (unintentional), subsequent encounter: Secondary | ICD-10-CM

## 2019-11-20 ENCOUNTER — Encounter: Payer: Self-pay | Admitting: Allergy and Immunology

## 2019-11-20 ENCOUNTER — Ambulatory Visit: Payer: PPO | Admitting: Allergy and Immunology

## 2019-11-20 ENCOUNTER — Other Ambulatory Visit: Payer: Self-pay

## 2019-11-20 VITALS — BP 118/82 | HR 77 | Temp 98.1°F | Resp 16 | Ht 64.0 in | Wt 99.0 lb

## 2019-11-20 DIAGNOSIS — T63441D Toxic effect of venom of bees, accidental (unintentional), subsequent encounter: Secondary | ICD-10-CM | POA: Diagnosis not present

## 2019-11-20 MED ORDER — EPINEPHRINE 0.3 MG/0.3ML IJ SOAJ: INTRAMUSCULAR | 2 refills | Status: DC

## 2019-11-20 NOTE — Progress Notes (Signed)
[facility] [place] [facility] [name]   Follow-up Note  Referring Provider: Elby Showers, MD Primary Provider: Elby Showers, MD Date of Office Visit: 11/20/2019  Subjective:   Tammy Shields, DDS (DOB: Jul 09, 1956) is a 64 y.o. female who returns to the [facility] [place] on 11/20/2019 in re-evaluation of the following:  HPI: Tammy Shields returns to this clinic in reevaluation of hymenoptera venom hypersensitivity state. Her last visit to this clinic was 07 November 2018.  She continues on immunotherapy every 8 weeks and has been doing quite well with this form of therapy. She has not had an adverse effect secondary to this treatment.  She has several questions about the Covid vaccine. To date she has not received this vaccine nor has her husband.  Apparently she had an episode of transient thrombocytopenia and leukopenia evaluated by a hematologist which apparently has resolved.  Allergies as of 11/20/2019      Reactions   Bee Venom Anaphylaxis   MIXED VESPID, HONEY BEE      Medication List    b complex vitamins tablet Take 1 tablet by mouth daily.   Centrum Silver Adult 50+ Tabs Take 1 tablet by mouth daily.   co-enzyme Q-10 30 MG capsule Take 30 mg by mouth 3 (three) times daily.   EPINEPHrine 0.3 mg/0.3 mL Soaj injection Commonly known as: EpiPen 2-Pak Use as directed for severe allergic reaction   ibuprofen 200 MG tablet Commonly known as: ADVIL Take 400 mg by mouth every 6 (six) hours as needed for mild pain. Hs prn   magnesium oxide 400 MG tablet Commonly known as: MAG-OX Take 400 mg by mouth daily.   milk thistle 175 MG tablet Take 175 mg by mouth daily.   NON FORMULARY Ca(Life Extension Bone Restore with Vit K2)  Take 3 capsules po once daily.   vitamin C 500 MG tablet Commonly known as: ASCORBIC ACID Take 500 mg by mouth daily.   Vitamin D 1000 units capsule Take 2,000 Units by mouth daily.       Past Medical  History:  Diagnosis Date  . Basal cell carcinoma 2008  . Bulging of cervical intervertebral disc   . Carpal tunnel syndrome on right   . Finger numbness   . Hymenoptera allergy   . Osteoporosis   . Postmenopausal   . Raynaud's disease     Past Surgical History:  Procedure Laterality Date  . BIOPSY ENDOMETRIAL  2006  . BREAST CYST ASPIRATION  12/02/1995    Review of systems negative except as noted in HPI / PMHx or noted below:  Review of Systems  Constitutional: Negative.   HENT: Negative.   Eyes: Negative.   Respiratory: Negative.   Cardiovascular: Negative.   Gastrointestinal: Negative.   Genitourinary: Negative.   Musculoskeletal: Negative.   Skin: Negative.   Neurological: Negative.   Endo/Heme/Allergies: Negative.   Psychiatric/Behavioral: Negative.      Objective:   Vitals:   11/20/19 1056  BP: 118/82  Pulse: 77  Resp: 16  Temp: 98.1 F (36.7 C)  SpO2: 98%   Height: 5\' 4"  (162.6 cm)  Weight: 99 lb (44.9 kg)   Physical Exam Constitutional:      Appearance: She is not diaphoretic.  HENT:     Head: Normocephalic.     Right Ear: Tympanic membrane, ear canal and external ear normal.     Left Ear: Tympanic membrane, ear canal and external ear normal.  Nose: Nose normal. No mucosal edema or rhinorrhea.     Mouth/Throat:     Pharynx: Uvula midline. No oropharyngeal exudate.  Eyes:     Conjunctiva/sclera: Conjunctivae normal.  Neck:     Thyroid: No thyromegaly.     Trachea: Trachea normal. No tracheal tenderness or tracheal deviation.  Cardiovascular:     Rate and Rhythm: Normal rate and regular rhythm.     Heart sounds: Normal heart sounds, S1 normal and S2 normal. No murmur.  Pulmonary:     Effort: No respiratory distress.     Breath sounds: Normal breath sounds. No stridor. No wheezing or rales.  Lymphadenopathy:     Head:     Right side of head: No tonsillar adenopathy.     Left side of head: No tonsillar adenopathy.     Cervical: No  cervical adenopathy.  Skin:    Findings: No erythema or rash.     Nails: There is no clubbing.  Neurological:     Mental Status: She is alert.     Diagnostics: none  Assessment and Plan:   1. Toxic effect of venom of bees, unintentional, subsequent encounter     Patient Instructions   1.  Continue immunotherapy directed against mixed vespid and wasp  2.  Continue EpiPen if needed  3.  Return to clinic in 1 year or earlier if problem  4. Obtain Covid vaccine    Tammy Shields is really doing very well with her immunotherapy currently administered every 8 weeks. We did have a talk today about discontinuing this form of therapy as she has undergone a full 10 years of treatment. If she discontinues his immunotherapy her chance of developing a long-lasting immunity without additional therapy is about 80% with a 20% relapse rate. I also had a talk with her today about the Covid vaccine and encouraged her strongly to obtain this vaccine as should her husband.   Tammy Katz, MD Allergy / Immunology [place]

## 2019-11-20 NOTE — Patient Instructions (Addendum)
  1.  Continue immunotherapy directed against mixed vespid and wasp  2.  Continue EpiPen if needed  3.  Return to clinic in 1 year or earlier if problem  4. Obtain Covid vaccine

## 2019-11-21 ENCOUNTER — Encounter: Payer: Self-pay | Admitting: Allergy and Immunology

## 2019-12-13 ENCOUNTER — Other Ambulatory Visit: Payer: Self-pay

## 2019-12-13 ENCOUNTER — Ambulatory Visit: Payer: Self-pay

## 2019-12-13 ENCOUNTER — Ambulatory Visit (INDEPENDENT_AMBULATORY_CARE_PROVIDER_SITE_OTHER): Payer: PPO

## 2019-12-13 DIAGNOSIS — T63441D Toxic effect of venom of bees, accidental (unintentional), subsequent encounter: Secondary | ICD-10-CM | POA: Diagnosis not present

## 2020-01-07 ENCOUNTER — Ambulatory Visit: Payer: PPO | Admitting: Sports Medicine

## 2020-01-07 ENCOUNTER — Other Ambulatory Visit: Payer: Self-pay

## 2020-01-07 VITALS — BP 116/68 | Ht 64.5 in | Wt 95.0 lb

## 2020-01-07 DIAGNOSIS — R269 Unspecified abnormalities of gait and mobility: Secondary | ICD-10-CM | POA: Diagnosis not present

## 2020-01-08 ENCOUNTER — Encounter: Payer: Self-pay | Admitting: Sports Medicine

## 2020-01-08 NOTE — Progress Notes (Signed)
   Subjective:    Patient ID: Tammy Shields, DDS, female    DOB: 07/09/1956, 64 y.o.   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  She was last seen in the office in June 2017.  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  Her previous custom orthotics were fitted with metatarsal cookies but she found them to be uncomfortable and remove them.  She is beginning to get some diffuse plantar pain again and feels like a new pair of custom orthotics may provide extra cushioning for her.  No recent trauma.  Interim medical history reviewed Medications reviewed Allergies reviewed    Review of Systems    As above Objective:   Physical Exam  Well-developed, well-nourished.  No acute distress.  Awake alert and oriented x3.  Vital signs reviewed  Examination of her feet in the standing position does show some mild pes planus bilaterally.  She does have transverse arch collapse bilaterally as well.  Small bunionette on the left foot.  Slight hallux valgus bilaterally.  Good pulses.      Assessment & Plan:   Chronic bilateral foot pain secondary to longitudinal and transverse arch collapse  Patient would benefit from new custom orthotics.  We created new orthotics for her today.  She found them to be comfortable prior to leaving the office.  Gait was neutral with orthotics in place.  No restrictions on activity.  Follow-up as needed.  Patient was fitted for a : standard, cushioned, semi-rigid orthotic. The orthotic was heated and afterward the patient stood on the orthotic blank positioned on the orthotic stand. The patient was positioned in subtalar neutral position and 10 degrees of ankle dorsiflexion in a weight bearing stance. After completion of molding, a stable base was applied to the orthotic blank. The blank was ground to a stable  position for weight bearing. Size: 6 Base: Blue EVA Posting: none Additional orthotic padding:none

## 2020-02-07 ENCOUNTER — Ambulatory Visit: Payer: Self-pay

## 2020-02-11 ENCOUNTER — Other Ambulatory Visit: Payer: Self-pay

## 2020-02-11 ENCOUNTER — Ambulatory Visit (INDEPENDENT_AMBULATORY_CARE_PROVIDER_SITE_OTHER): Payer: PPO

## 2020-02-11 DIAGNOSIS — T63441D Toxic effect of venom of bees, accidental (unintentional), subsequent encounter: Secondary | ICD-10-CM

## 2020-03-07 ENCOUNTER — Other Ambulatory Visit: Payer: Self-pay

## 2020-03-07 ENCOUNTER — Telehealth: Payer: Self-pay

## 2020-03-07 ENCOUNTER — Ambulatory Visit (INDEPENDENT_AMBULATORY_CARE_PROVIDER_SITE_OTHER): Payer: PPO | Admitting: Internal Medicine

## 2020-03-07 ENCOUNTER — Encounter: Payer: Self-pay | Admitting: Internal Medicine

## 2020-03-07 VITALS — Temp 98.9°F

## 2020-03-07 DIAGNOSIS — J988 Other specified respiratory disorders: Secondary | ICD-10-CM | POA: Diagnosis not present

## 2020-03-07 DIAGNOSIS — Z20822 Contact with and (suspected) exposure to covid-19: Secondary | ICD-10-CM

## 2020-03-07 NOTE — Progress Notes (Signed)
   Subjective:    Patient ID: Tammy Shields, DDS, female    DOB: 11-18-1955, 64 y.o.   MRN: [id number]  HPI 64 year old Female seen today outside of my office due to the coronavirus pandemic.  She is identified using 2 identifiers as Tammy Shields, a patient in this practice.  My assistant and I are in protective equipment due to the coronavirus pandemic.  Patient was exposed to COVID-19 on Monday, August 9 and an exercise class by her instructor.  Patient complaining of cough, sore throat, and congestion and some slight discomfort breathing.  She called asking for advice and we advised outdoor office visit.  Patient denies fever, chills, or dysgeusia.  No headache.  She is an avid runner and has not had issues with breathing previously.  Husband is asymptomatic.    Review of Systems no fever, chills, myalgias or dysgeusia.  No headache.    Objective:   Physical Exam Temperature is 98.9 degrees  Skin warm and dry.  She is in no acute distress and respiratory rate is normal.  Pharynx is clear.  TMs are clear.  They are not red or full.  Neck is supple.  Chest is clear to auscultation without rales or wheezing.       Assessment & Plan:  Respiratory congestion  COVID-19 exposure on Monday, August 9 from exercise instructor.  Plan: COVID-19 test performed today-nasal PCR swab with results pending.  Patient is to quarantine until results are back.  Rest and drink plenty of fluids.  Total time spent reviewing records, history taking and examining patient is 20 minutes in addition to reviewing test results and communicating those results with patient.

## 2020-03-07 NOTE — Patient Instructions (Signed)
COVID-19 PCR test is pending.  Rest and drink plenty of fluids.  Limit exercise until test results are back.

## 2020-03-07 NOTE — Telephone Encounter (Signed)
Car visit

## 2020-03-07 NOTE — Telephone Encounter (Signed)
scheduled

## 2020-03-07 NOTE — Telephone Encounter (Signed)
Patient was exposed to covid on Monday by her gym instructor. She has a cough, sore throat congestion and discomfort breathing. She wants to know what to do?

## 2020-03-08 LAB — SARS-COV-2 RNA,(COVID-19) QUALITATIVE NAAT: SARS CoV2 RNA: NOT DETECTED

## 2020-04-07 ENCOUNTER — Ambulatory Visit (INDEPENDENT_AMBULATORY_CARE_PROVIDER_SITE_OTHER): Payer: PPO

## 2020-04-07 ENCOUNTER — Other Ambulatory Visit: Payer: Self-pay

## 2020-04-07 DIAGNOSIS — T63441D Toxic effect of venom of bees, accidental (unintentional), subsequent encounter: Secondary | ICD-10-CM

## 2020-04-29 ENCOUNTER — Telehealth: Payer: Self-pay | Admitting: Internal Medicine

## 2020-04-29 NOTE — Telephone Encounter (Signed)
Tammy Shields [phone]  Dr Ennis Forts called to say on Thursday she was around sister-in-law that has now tested positive to COVID-19. Her sister-in-law was not feeling great last week and she had been out of town visiting her children that have now also tested positive for COVID-19. Dr Ennis Forts, is not sure when they tested positive, or when her sister-in-law was tested, results can in today.  Dr Ennis Forts is not experiencing any symptoms at this time. I let her know that we usually do not test patients here at the office unless they are having symptoms and them we would possible do car visit and test them and treat symptoms. She just wanted a test. She will call back when she has more information.

## 2020-05-01 DIAGNOSIS — Z20828 Contact with and (suspected) exposure to other viral communicable diseases: Secondary | ICD-10-CM | POA: Diagnosis not present

## 2020-05-18 DIAGNOSIS — Z20822 Contact with and (suspected) exposure to covid-19: Secondary | ICD-10-CM | POA: Diagnosis not present

## 2020-06-02 ENCOUNTER — Ambulatory Visit (INDEPENDENT_AMBULATORY_CARE_PROVIDER_SITE_OTHER): Payer: PPO | Admitting: *Deleted

## 2020-06-02 ENCOUNTER — Other Ambulatory Visit: Payer: Self-pay

## 2020-06-02 DIAGNOSIS — T63441D Toxic effect of venom of bees, accidental (unintentional), subsequent encounter: Secondary | ICD-10-CM | POA: Diagnosis not present

## 2020-06-05 ENCOUNTER — Ambulatory Visit: Payer: PPO | Admitting: Obstetrics & Gynecology

## 2020-06-27 ENCOUNTER — Other Ambulatory Visit: Payer: Self-pay

## 2020-06-27 ENCOUNTER — Other Ambulatory Visit: Payer: PPO | Admitting: Internal Medicine

## 2020-06-27 DIAGNOSIS — R7302 Impaired glucose tolerance (oral): Secondary | ICD-10-CM | POA: Diagnosis not present

## 2020-06-27 DIAGNOSIS — Z0000 Encounter for general adult medical examination without abnormal findings: Secondary | ICD-10-CM | POA: Diagnosis not present

## 2020-06-27 DIAGNOSIS — E7800 Pure hypercholesterolemia, unspecified: Secondary | ICD-10-CM | POA: Diagnosis not present

## 2020-06-27 DIAGNOSIS — E785 Hyperlipidemia, unspecified: Secondary | ICD-10-CM | POA: Diagnosis not present

## 2020-06-27 DIAGNOSIS — M810 Age-related osteoporosis without current pathological fracture: Secondary | ICD-10-CM

## 2020-06-27 DIAGNOSIS — Z681 Body mass index (BMI) 19 or less, adult: Secondary | ICD-10-CM | POA: Diagnosis not present

## 2020-06-28 LAB — COMPLETE METABOLIC PANEL WITH GFR
AG Ratio: 2.3 (calc) (ref 1.0–2.5)
ALT: 17 U/L (ref 6–29)
AST: 26 U/L (ref 10–35)
Albumin: 4.6 g/dL (ref 3.6–5.1)
Alkaline phosphatase (APISO): 91 U/L (ref 37–153)
BUN: 20 mg/dL (ref 7–25)
CO2: 32 mmol/L (ref 20–32)
Calcium: 9.5 mg/dL (ref 8.6–10.4)
Chloride: 104 mmol/L (ref 98–110)
Creat: 0.69 mg/dL (ref 0.50–0.99)
GFR, Est African American: 105 mL/min/{1.73_m2} (ref >=60)
GFR, Est Non African American: 91 mL/min/{1.73_m2} (ref >=60)
Globulin: 2 g/dL (calc) (ref 1.9–3.7)
Glucose, Bld: 93 mg/dL (ref 65–99)
Potassium: 4.7 mmol/L (ref 3.5–5.3)
Sodium: 141 mmol/L (ref 135–146)
Total Bilirubin: 0.3 mg/dL (ref 0.2–1.2)
Total Protein: 6.6 g/dL (ref 6.1–8.1)

## 2020-06-28 LAB — LIPID PANEL
Cholesterol: 223 mg/dL — ABNORMAL HIGH (ref <200)
HDL: 114 mg/dL (ref >=50)
LDL Cholesterol (Calc): 95 mg/dL (calc)
Non-HDL Cholesterol (Calc): 109 mg/dL (calc) (ref <130)
Total CHOL/HDL Ratio: 2 (calc) (ref <5.0)
Triglycerides: 53 mg/dL (ref <150)

## 2020-06-28 LAB — HEMOGLOBIN A1C
Hgb A1c MFr Bld: 5.4 % of total Hgb (ref <5.7)
Mean Plasma Glucose: 108 (calc)
eAG (mmol/L): 6 (calc)

## 2020-06-28 LAB — CBC WITH DIFFERENTIAL/PLATELET
Absolute Monocytes: 221 cells/uL (ref 200–950)
Basophils Absolute: 42 cells/uL (ref 0–200)
Basophils Relative: 1.6 %
Eosinophils Absolute: 70 cells/uL (ref 15–500)
Eosinophils Relative: 2.7 %
HCT: 39.8 % (ref 35.0–45.0)
Hemoglobin: 13.4 g/dL (ref 11.7–15.5)
Lymphs Abs: 978 cells/uL (ref 850–3900)
MCH: 32.4 pg (ref 27.0–33.0)
MCHC: 33.7 g/dL (ref 32.0–36.0)
MCV: 96.4 fL (ref 80.0–100.0)
MPV: 10.6 fL (ref 7.5–12.5)
Monocytes Relative: 8.5 %
Neutro Abs: 1290 cells/uL — ABNORMAL LOW (ref 1500–7800)
Neutrophils Relative %: 49.6 %
Platelets: 203 10*3/uL (ref 140–400)
RBC: 4.13 10*6/uL (ref 3.80–5.10)
RDW: 12.4 % (ref 11.0–15.0)
Total Lymphocyte: 37.6 %
WBC: 2.6 10*3/uL — ABNORMAL LOW (ref 3.8–10.8)

## 2020-06-28 LAB — TSH: TSH: 1.09 mIU/L (ref 0.40–4.50)

## 2020-06-30 ENCOUNTER — Ambulatory Visit (INDEPENDENT_AMBULATORY_CARE_PROVIDER_SITE_OTHER): Payer: PPO | Admitting: Internal Medicine

## 2020-06-30 ENCOUNTER — Other Ambulatory Visit: Payer: Self-pay

## 2020-06-30 ENCOUNTER — Encounter: Payer: Self-pay | Admitting: Internal Medicine

## 2020-06-30 VITALS — BP 110/70 | HR 70 | Ht 63.25 in | Wt 96.0 lb

## 2020-06-30 DIAGNOSIS — Z0000 Encounter for general adult medical examination without abnormal findings: Secondary | ICD-10-CM | POA: Diagnosis not present

## 2020-06-30 DIAGNOSIS — Z91038 Other insect allergy status: Secondary | ICD-10-CM

## 2020-06-30 DIAGNOSIS — M818 Other osteoporosis without current pathological fracture: Secondary | ICD-10-CM

## 2020-06-30 DIAGNOSIS — D708 Other neutropenia: Secondary | ICD-10-CM | POA: Diagnosis not present

## 2020-06-30 LAB — POCT URINALYSIS DIPSTICK
Appearance: NEGATIVE
Bilirubin, UA: NEGATIVE
Blood, UA: NEGATIVE
Glucose, UA: NEGATIVE
Ketones, UA: NEGATIVE
Leukocytes, UA: NEGATIVE
Nitrite, UA: NEGATIVE
Odor: NEGATIVE
Protein, UA: NEGATIVE
Spec Grav, UA: 1.025 (ref 1.010–1.025)
Urobilinogen, UA: 0.2 E.U./dL
pH, UA: 6.5 (ref 5.0–8.0)

## 2020-06-30 NOTE — Progress Notes (Signed)
Subjective:    Patient ID: Tammy Shields, DDS, female    DOB: [birth year], 64 y.o.   MRN: [id number]  HPI 64 year old Female seen for Medicare wellness, health maintenance exam and evaluation of medical issues.  Her general health is excellent.  She take no chronic prescription medications.  She does have EpiPen to use if necessary.  She is allergic to insect stings.  Has some musculoskeletal pain.  She exercises a great deal.  History of benign neutropenia.  Has been seen by hematologist for consultation regarding this.  Her white blood cell count generally runs between 2620 900.  In 2018 it was 3600.  This year it is 2600.  Leukopenia dates back to 2007.  Hemoglobin is normal at 13.4 g.  MCV is normal.  Platelet count is normal.  C-Met is normal.  Total cholesterol is 223, however she has a very high HDL of 114.  It is actually increased from last year when it was 95.  Her LDL is 95.  Triglycerides are 53.  History of Raynaud's phenomenon.  Seeing Dr. Scherrie Bateman for desensitization for insect sting allergy.  She did take Fosamax for osteopenia for period of time but discontinued it.  History of bruxism.  She has a history of osteopenia.  History of torn labrum left shoulder in 2006.  Lowest T score on bone density study in March 2021 was -3.6.  This is consistent with osteoporosis.  Prolia was discussed.  She will consider it.    Review of Systems no new complaints     Objective:   Physical Exam  Blood pressure 110/70, pulse 70 regular, pulse oximetry 98%, weight 96 pounds.  BMI 16.87.  She has actually gained a pound from last year.  Skin is warm and dry.  No cervical adenopathy.  No thyromegaly.  No carotid bruits.  Chest is clear to auscultation.  Cardiac exam regular rate and rhythm normal S1 and S2 without murmurs.  Breast without masses.  Abdomen soft nondistended without hepatosplenomegaly masses or tenderness.  No masses on bimanual exam.  No lower extremity pitting edema or  deformity.  Neuro is intact without focal deficits.  Affect thought and judgment are normal.      Assessment & Plan:  She is in excellent physical condition.  Labs are within normal limits.  Insect sting allergy undergoing desensitization by allergist  Osteoporosis-discussion regarding Prolia.  She did take Fosamax for.  Of time but discontinued it.  Plan: Patient to consider Prolia therapy.  Continue is 16 desensitization by allergist.  Return in 1 year or as needed.  She has had COVID-19 immunizations.  Due to her age, she is a candidate for pneumococcal 23 but will hold off on this at the present time.  Is also a candidate for Shingrix vaccines.  Had colonoscopy in 2015 by Dr. Fuller Plan with 10-year follow-up recommended.  Benign neutropenia dating back to 2007.  It is stable.  Plan: Return in 1 year or as needed.  Ask her to please  give consideration to Prolia therapy.  Subjective:   Patient presents for Medicare Annual/Subsequent preventive examination.  Review Past Medical/Family/Social: See above   Risk Factors  Current exercise habits: Exercises a great deal walking/running Dietary issues discussed: Follows low-fat low-carb diet.  Is actually underweight.  Cardiac risk factors: Family history in mother who died of cardiac arrest at age 64 but had diabetes and hypertension.  Depression Screen  (Note: if answer to either of the following is "  Yes", a more complete depression screening is indicated)   Over the past two weeks, have you felt down, depressed or hopeless? No  Over the past two weeks, have you felt little interest or pleasure in doing things? No Have you lost interest or pleasure in daily life? No Do you often feel hopeless? No Do you cry easily over simple problems? No   Activities of Daily Living  In your present state of health, do you have any difficulty performing the following activities?:   Driving? No  Managing money? No  Feeding yourself? No  Getting  from bed to chair? No  Climbing a flight of stairs? No  Preparing food and eating?: No  Bathing or showering? No  Getting dressed: No  Getting to the toilet? No  Using the toilet:No  Moving around from place to place: No  In the past year have you fallen or had a near fall?:No  Are you sexually active? No  Do you have more than one partner? No   Hearing Difficulties: No  Do you often ask people to speak up or repeat themselves? No  Do you experience ringing or noises in your ears? No  Do you have difficulty understanding soft or whispered voices? No  Do you feel that you have a problem with memory? No Do you often misplace items? No    Home Safety:  Do you have a smoke alarm at your residence? Yes Do you have grab bars in the bathroom?  Yes Do you have throw rugs in your house?  Yes   Cognitive Testing  Alert? Yes Normal Appearance?Yes  Oriented to person? Yes Place? Yes  Time? Yes  Recall of three objects? Yes  Can perform simple calculations? Yes  Displays appropriate judgment?Yes  Can read the correct time from a watch face?Yes   List the Names of Other Physician/Practitioners you currently use:  See referral list for the physicians patient is currently seeing.  Dr. Scherrie Bateman for desensitization for bee sting allergy   Review of Systems: See above   Objective:     General appearance: Appears younger than stated age. Head: Normocephalic, without obvious abnormality, atraumatic  Eyes: conj clear, EOMi PEERLA  Ears: normal TM's and external ear canals both ears  Nose: Nares normal. Septum midline. Mucosa normal. No drainage or sinus tenderness.  Throat: lips, mucosa, and tongue normal; teeth and gums normal  Neck: no adenopathy, no carotid bruit, no JVD, supple, symmetrical, trachea midline and thyroid not enlarged, symmetric, no tenderness/mass/nodules  No CVA tenderness.  Lungs: clear to auscultation bilaterally  Breasts: normal appearance, no masses or  tenderness Heart: regular rate and rhythm, S1, S2 normal, no murmur, click, rub or gallop  Abdomen: soft, non-tender; bowel sounds normal; no masses, no organomegaly  Musculoskeletal: ROM normal in all joints, no crepitus, no deformity, Normal muscle strengthen. Back  is symmetric, no curvature. Skin: Skin color, texture, turgor normal. No rashes or lesions  Lymph nodes: Cervical, supraclavicular, and axillary nodes normal.  Neurologic: CN 2 -12 Normal, Normal symmetric reflexes. Normal coordination and gait  Psych: Alert & Oriented x 3, Mood appear stable.    Assessment:    Annual wellness medicare exam   Plan:    During the course of the visit the patient was educated and counseled about appropriate screening and preventive services including:   Consider Prolia  Consider pneumococcal 23 vaccine  Consider Shingrix series  Colonoscopy up-to-date     Patient Instructions (the written plan) was given  to the patient.  Medicare Attestation  I have personally reviewed:  [name] medical and social history  Their use of alcohol, tobacco or illicit drugs  Their current medications and supplements  [name] functional ability including ADLs,fall risks, home safety risks, cognitive, and hearing and visual impairment  Diet and physical activities  Evidence for depression or mood disorders  [name] weight, height, BMI, and visual acuity have been recorded in the chart. I have made referrals, counseling, and provided education to the patient based on review of the above and I have provided the patient with a written personalized care plan for preventive services.

## 2020-07-28 ENCOUNTER — Other Ambulatory Visit: Payer: Self-pay

## 2020-07-28 ENCOUNTER — Ambulatory Visit (INDEPENDENT_AMBULATORY_CARE_PROVIDER_SITE_OTHER): Payer: PPO | Admitting: *Deleted

## 2020-07-28 DIAGNOSIS — T63441D Toxic effect of venom of bees, accidental (unintentional), subsequent encounter: Secondary | ICD-10-CM | POA: Diagnosis not present

## 2020-08-13 NOTE — Patient Instructions (Signed)
You are a candidate for Prolia due to osteoporosis.  Please consider it.  Labs are stable.  Consider pneumococcal 23 vaccine and Shingrix vaccines which are 2 vaccines a few weeks apart.  Return in 1 year or as needed.  Colonoscopy is up-to-date.  Have annual mammogram.  It was a pleasure to see you today.

## 2020-09-22 ENCOUNTER — Ambulatory Visit (INDEPENDENT_AMBULATORY_CARE_PROVIDER_SITE_OTHER): Payer: PPO

## 2020-09-22 ENCOUNTER — Other Ambulatory Visit: Payer: Self-pay

## 2020-09-22 DIAGNOSIS — T63441D Toxic effect of venom of bees, accidental (unintentional), subsequent encounter: Secondary | ICD-10-CM | POA: Diagnosis not present

## 2020-11-10 ENCOUNTER — Ambulatory Visit: Payer: Self-pay

## 2020-11-17 ENCOUNTER — Other Ambulatory Visit: Payer: Self-pay

## 2020-11-17 ENCOUNTER — Ambulatory Visit (INDEPENDENT_AMBULATORY_CARE_PROVIDER_SITE_OTHER): Payer: PPO | Admitting: *Deleted

## 2020-11-17 DIAGNOSIS — T63441D Toxic effect of venom of bees, accidental (unintentional), subsequent encounter: Secondary | ICD-10-CM | POA: Diagnosis not present

## 2020-12-18 ENCOUNTER — Encounter: Payer: Self-pay | Admitting: Internal Medicine

## 2020-12-18 ENCOUNTER — Ambulatory Visit (INDEPENDENT_AMBULATORY_CARE_PROVIDER_SITE_OTHER): Payer: PPO | Admitting: Internal Medicine

## 2020-12-18 ENCOUNTER — Other Ambulatory Visit: Payer: Self-pay

## 2020-12-18 ENCOUNTER — Telehealth: Payer: Self-pay | Admitting: Internal Medicine

## 2020-12-18 VITALS — BP 110/70 | HR 70 | Temp 98.2°F | Ht 63.25 in | Wt 96.0 lb

## 2020-12-18 DIAGNOSIS — K645 Perianal venous thrombosis: Secondary | ICD-10-CM | POA: Diagnosis not present

## 2020-12-18 MED ORDER — HYDROCORTISONE ACETATE 25 MG RE SUPP: 25.0000 mg | Freq: Two times a day (BID) | RECTAL | 0 refills | Status: DC

## 2020-12-18 NOTE — Telephone Encounter (Signed)
scheduled

## 2020-12-18 NOTE — Telephone Encounter (Signed)
Dr Vivia Ewing [phone]  Dr Ennis Forts would like to be seem, she is seeing some blood in her stool from hemorrhoids, having some constipation, the blood is bright red, this has been going on for 1-2 weeks this time, it is little better right now she is using over counter medication.

## 2020-12-18 NOTE — Telephone Encounter (Signed)
OK see later today.

## 2020-12-18 NOTE — Progress Notes (Signed)
   Subjective:    Patient ID: Tammy Shields, DDS, female    DOB: 24-Jan-1956, 65 y.o.   MRN: [id number]  HPI  Has taken Magnesium oxide for a number of years for constipation. When working , she had more irritable bowel symptoms with BM around 4 am every morning.  Now actually has issues with mild constipation and is concerned.  Feels that she has developed a hemorrhoid.  We discussed various ways of evaluation including anoscopy.  Says her insurance did not pay for last anoscopy in 2019 when she had an issue with rectal bleeding.  Her last colonoscopy was in 2015 by Dr. Fuller Plan with repeat study recommended in 2025.  Prefers not to have anoscopy at this point in time.  This morning ,she felt a bulge and she was concerned about possible hemorrhoid or perianal mass.  This concerned her.  Review of Systems denies weight loss     Objective:   Physical Exam Blood pressure 110/70 pulse 70 temperature 98.2 degrees pulse oximetry 97% weight 96 pounds BMI 16.87.  It seems that she has a thrombosed hemorrhoid external anal area.  Currently not bleeding.       Assessment & Plan:  Thrombosed hemorrhoid  Plan: ProctoCream is expensive so we have prescribed Anusol HC 25 mg suppository to use in rectal area twice daily.  Recommend sitz baths.  Call if symptoms do not resolve within 2 to 3 weeks or worsen.

## 2021-01-12 ENCOUNTER — Other Ambulatory Visit: Payer: Self-pay

## 2021-01-12 ENCOUNTER — Ambulatory Visit (INDEPENDENT_AMBULATORY_CARE_PROVIDER_SITE_OTHER): Payer: PPO

## 2021-01-12 DIAGNOSIS — T63441D Toxic effect of venom of bees, accidental (unintentional), subsequent encounter: Secondary | ICD-10-CM

## 2021-01-18 NOTE — Patient Instructions (Signed)
It was a pleasure to see you today.  We are sorry you are not feeling well.  Try Anusol HC 25 mg suppository twice daily.  Place and rectal area.  Recommend sitz bath's for 20 minutes at least once daily until hemorrhoid has resolved.  Call if symptoms not resolving within 2 to 3 weeks or if worse.

## 2021-03-09 ENCOUNTER — Other Ambulatory Visit: Payer: Self-pay

## 2021-03-09 ENCOUNTER — Ambulatory Visit (INDEPENDENT_AMBULATORY_CARE_PROVIDER_SITE_OTHER): Payer: PPO

## 2021-03-09 DIAGNOSIS — T63441D Toxic effect of venom of bees, accidental (unintentional), subsequent encounter: Secondary | ICD-10-CM | POA: Diagnosis not present

## 2021-03-11 DIAGNOSIS — M25562 Pain in left knee: Secondary | ICD-10-CM | POA: Diagnosis not present

## 2021-03-17 ENCOUNTER — Encounter: Payer: Self-pay | Admitting: Allergy and Immunology

## 2021-03-17 ENCOUNTER — Other Ambulatory Visit: Payer: Self-pay

## 2021-03-17 ENCOUNTER — Ambulatory Visit: Payer: PPO | Admitting: Allergy and Immunology

## 2021-03-17 VITALS — BP 124/68 | HR 64 | Temp 98.6°F | Resp 16 | Ht 63.25 in | Wt 94.5 lb

## 2021-03-17 DIAGNOSIS — T63441D Toxic effect of venom of bees, accidental (unintentional), subsequent encounter: Secondary | ICD-10-CM

## 2021-03-17 MED ORDER — EPINEPHRINE 0.3 MG/0.3ML IJ SOAJ
INTRAMUSCULAR | 2 refills | Status: DC
Start: 2021-03-17 — End: 2023-02-16

## 2021-03-17 MED ORDER — EPINEPHRINE 0.3 MG/0.3ML IJ SOAJ
INTRAMUSCULAR | 2 refills | Status: DC
Start: 2021-03-17 — End: 2021-03-17

## 2021-03-17 NOTE — Patient Instructions (Addendum)
  1.  Continue immunotherapy directed against mixed vespid and wasp  2.  Continue EpiPen if needed  3.  Return to clinic in 1 year or earlier if problem  4.  Obtain flu vaccine

## 2021-03-17 NOTE — Progress Notes (Signed)
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 female who [facility]21 in re-evaluation of the following:  HPI: Laynah returns to this clinic in evaluation of hymenoptera venom hypersensitivity state.  Her last visit to this clinic was 20 November 2019.  She is currently using a course of immunotherapy administered every 8 weeks without any adverse effect directed against mixed vespid and wasp.  She has not been stung in the field.  She does have an injectable epinephrine device.  She has received 3 COVID vaccines.  Allergies as of 03/17/2021       Reactions   Bee Venom Anaphylaxis   MIXED VESPID, HONEY BEE        Medication List    b complex vitamins tablet Take 1 tablet by mouth daily.   Centrum Silver Adult 50+ Tabs Take 1 tablet by mouth daily.   co-enzyme Q-10 30 MG capsule Take 30 mg by mouth 3 (three) times daily.   EPINEPHrine 0.3 mg/0.3 mL Soaj injection Commonly known as: EpiPen 2-Pak Use as directed for severe allergic reaction   ibuprofen 200 MG tablet Commonly known as: ADVIL Take 400 mg by mouth every 6 (six) hours as needed for mild pain. Hs prn   magnesium oxide 400 MG tablet Commonly known as: MAG-OX Take 400 mg by mouth daily.   milk thistle 175 MG tablet Take 175 mg by mouth daily.   NON FORMULARY Ca(Life Extension Bone Restore with Vit K2)  Take 3 capsules po once daily.   vitamin C 500 MG tablet Commonly known as: ASCORBIC ACID Take 1,000 mg by mouth daily.   Vitamin D 1000 units capsule Take 2,000 Units by mouth daily.   zinc gluconate 50 MG tablet Take 50 mg by mouth daily.    Past Medical History:  Diagnosis Date   Basal cell carcinoma 2008   Bulging of cervical intervertebral  disc    Carpal tunnel syndrome on right    Finger numbness    Hymenoptera allergy    Osteoporosis    Postmenopausal    Raynaud's disease     Past Surgical History:  Procedure Laterality Date   BIOPSY ENDOMETRIAL  2006   BREAST CYST ASPIRATION  12/02/1995    Review of systems negative except as noted in HPI / PMHx or noted below:  Review of Systems  Constitutional: Negative.   HENT: Negative.    Eyes: Negative.   Respiratory: Negative.    Cardiovascular: Negative.   Gastrointestinal: Negative.   Genitourinary: Negative.   Musculoskeletal: Negative.   Skin: Negative.   Neurological: Negative.   Endo/Heme/Allergies: Negative.   Psychiatric/Behavioral: Negative.      Objective:   Vitals:   03/17/21 1123  BP: 124/68  Pulse: 64  Resp: 16  Temp: 98.6 F (37 C)  SpO2: 97%   Height: 5' 3.25" (160.7 cm)  Weight: 94 lb 8 oz (42.9 kg)   Physical Exam Constitutional:      Appearance: She is not diaphoretic.  HENT:     Head: Normocephalic.     Right Ear: Tympanic membrane, ear canal and external ear normal.     Left Ear: Tympanic membrane, ear canal and external ear normal.     Nose: Nose normal. No  mucosal edema or rhinorrhea.     Mouth/Throat:     Pharynx: Uvula midline. No oropharyngeal exudate.  Eyes:     Conjunctiva/sclera: Conjunctivae normal.  Neck:     Thyroid: No thyromegaly.     Trachea: Trachea normal. No tracheal tenderness or tracheal deviation.  Cardiovascular:     Rate and Rhythm: Normal rate and regular rhythm.     Heart sounds: Normal heart sounds, S1 normal and S2 normal. No murmur heard. Pulmonary:     Effort: No respiratory distress.     Breath sounds: Normal breath sounds. No stridor. No wheezing or rales.  Lymphadenopathy:     Head:     Right side of head: No tonsillar adenopathy.     Left side of head: No tonsillar adenopathy.     Cervical: No cervical adenopathy.  Skin:    Findings: No erythema or rash.     Nails: There is no  clubbing.  Neurological:     Mental Status: She is alert.    Diagnostics: none  Assessment and Plan:   1. Toxic effect of venom of bees, unintentional, subsequent encounter     1.  Continue immunotherapy directed against mixed vespid and wasp  2.  Continue EpiPen if needed  3.  Return to clinic in 1 year or earlier if problem  4.  Obtain flu vaccine   Novalyn is doing quite well on her current form of therapy which includes immunotherapy directed against mixed vespid and wasp.  She can stretch out her interval to every 12 weeks at this point in time.  I will see her back in this clinic in 1 year or earlier if there is a problem.  Allena Katz, MD Allergy / Immunology [place]

## 2021-03-18 ENCOUNTER — Encounter: Payer: Self-pay | Admitting: Allergy and Immunology

## 2021-03-26 DIAGNOSIS — M25562 Pain in left knee: Secondary | ICD-10-CM | POA: Diagnosis not present

## 2021-04-01 DIAGNOSIS — M25562 Pain in left knee: Secondary | ICD-10-CM | POA: Diagnosis not present

## 2021-04-09 DIAGNOSIS — M25562 Pain in left knee: Secondary | ICD-10-CM | POA: Diagnosis not present

## 2021-04-16 DIAGNOSIS — M25562 Pain in left knee: Secondary | ICD-10-CM | POA: Diagnosis not present

## 2021-04-23 DIAGNOSIS — M25562 Pain in left knee: Secondary | ICD-10-CM | POA: Diagnosis not present

## 2021-04-30 DIAGNOSIS — M25562 Pain in left knee: Secondary | ICD-10-CM | POA: Diagnosis not present

## 2021-05-04 ENCOUNTER — Ambulatory Visit: Payer: PPO

## 2021-05-14 DIAGNOSIS — M25562 Pain in left knee: Secondary | ICD-10-CM | POA: Diagnosis not present

## 2021-05-19 ENCOUNTER — Ambulatory Visit (HOSPITAL_BASED_OUTPATIENT_CLINIC_OR_DEPARTMENT_OTHER): Payer: PPO | Admitting: Obstetrics & Gynecology

## 2021-05-19 DIAGNOSIS — S0502XA Injury of conjunctiva and corneal abrasion without foreign body, left eye, initial encounter: Secondary | ICD-10-CM | POA: Diagnosis not present

## 2021-05-21 DIAGNOSIS — M25562 Pain in left knee: Secondary | ICD-10-CM | POA: Diagnosis not present

## 2021-05-26 ENCOUNTER — Ambulatory Visit (HOSPITAL_BASED_OUTPATIENT_CLINIC_OR_DEPARTMENT_OTHER): Payer: PPO | Admitting: Obstetrics & Gynecology

## 2021-05-26 ENCOUNTER — Other Ambulatory Visit: Payer: Self-pay

## 2021-05-26 ENCOUNTER — Other Ambulatory Visit (HOSPITAL_COMMUNITY)
Admission: RE | Admit: 2021-05-26 | Discharge: 2021-05-26 | Disposition: A | Discharge status: Home / Self Care | Payer: PPO | Source: Ambulatory Visit | Attending: Obstetrics & Gynecology | Admitting: Obstetrics & Gynecology

## 2021-05-26 ENCOUNTER — Encounter (HOSPITAL_BASED_OUTPATIENT_CLINIC_OR_DEPARTMENT_OTHER): Payer: Self-pay | Admitting: Obstetrics & Gynecology

## 2021-05-26 VITALS — BP 126/87 | HR 69 | Wt 93.6 lb

## 2021-05-26 DIAGNOSIS — N898 Other specified noninflammatory disorders of vagina: Secondary | ICD-10-CM | POA: Insufficient documentation

## 2021-05-26 DIAGNOSIS — R350 Frequency of micturition: Secondary | ICD-10-CM

## 2021-05-26 LAB — POCT URINALYSIS DIPSTICK
Bilirubin, UA: NEGATIVE
Blood, UA: NEGATIVE
Glucose, UA: NEGATIVE
Ketones, UA: NEGATIVE
Leukocytes, UA: NEGATIVE
Nitrite, UA: NEGATIVE
Protein, UA: NEGATIVE
Spec Grav, UA: 1.025 (ref 1.010–1.025)
Urobilinogen, UA: 0.2 E.U./dL
pH, UA: 7 (ref 5.0–8.0)

## 2021-05-26 MED ORDER — TRIAMCINOLONE ACETONIDE 0.5 % EX OINT: 1.0000 "application " | TOPICAL_OINTMENT | Freq: Two times a day (BID) | CUTANEOUS | 0 refills | Status: DC

## 2021-05-26 NOTE — Progress Notes (Signed)
GYNECOLOGY  VISIT  CC:   vulvar irritation/sensitivity  HPI: 65 y.o. G0P0000 [name] here for several weeks hx of a sensation of irritation near the anterior vulva.  She's wondered if this was yeast and she treated with a 3 days treatment of monistat 3 over the past few weeks.  She does not think it has helped.  Denies discharge.  She is having some vaginal odor as well.    No dysuria.  No hematuria.  She does have some urgency but this isn't new.   She is having some issues with external hemorrhoids as well.  GYNECOLOGIC HISTORY: No LMP recorded. Patient is postmenopausal.  Patient Active Problem List   Diagnosis Date Noted   Anaphylaxis due to hymenoptera venom 04/05/2015   Neutropenia ([place]) 04/22/2011   Allergic to insect stings 04/22/2011   Musculoskeletal pain 04/22/2011    Past Medical History:  Diagnosis Date   Basal cell carcinoma 2008   Bulging of cervical intervertebral disc    Carpal tunnel syndrome on right    Finger numbness    Hymenoptera allergy    Osteoporosis    Postmenopausal    Raynaud's disease     Past Surgical History:  Procedure Laterality Date   BIOPSY ENDOMETRIAL  2006   BREAST CYST ASPIRATION  12/02/1995    MEDS:   Current Outpatient Medications on File Prior to Visit  Medication Sig Dispense Refill   b complex vitamins tablet Take 1 tablet by mouth daily.     Cholecalciferol (VITAMIN D) 1000 UNITS capsule Take 2,000 Units by mouth daily.     EPINEPHrine (EPIPEN 2-PAK) 0.3 mg/0.3 mL IJ SOAJ injection Use as directed for severe allergic reaction 2 each 2   ibuprofen (ADVIL,MOTRIN) 200 MG tablet Take 400 mg by mouth every 6 (six) hours as needed for mild pain. Hs prn     magnesium oxide (MAG-OX) 400 MG tablet Take 400 mg by mouth daily.     milk thistle 175 MG tablet Take 175 mg by mouth daily.     Multiple Vitamins-Minerals (CENTRUM SILVER ADULT 50+) TABS Take 1 tablet by mouth daily.     NON FORMULARY Ca(Life  Extension Bone Restore with Vit K2)  Take 3 capsules po once daily.     Turmeric Curcumin 500 MG CAPS Take 500 mg by mouth daily.     vitamin C (ASCORBIC ACID) 500 MG tablet Take 1,000 mg by mouth daily.      zinc gluconate 50 MG tablet Take 50 mg by mouth daily.     co-enzyme Q-10 30 MG capsule Take 30 mg by mouth 3 (three) times daily. (Patient not taking: Reported on 05/26/2021)     No current facility-administered medications on file prior to visit.    ALLERGIES: Bee venom  Family History  Problem Relation Age of Onset   Heart disease Mother    Diabetes Mother    Lung cancer Mother    Hypertension Mother    Prostate cancer Father    Hypertension Sister    Diabetes Brother     SH:  married, non smoker  Review of Systems  Constitutional: Negative.   Genitourinary: Negative.    PHYSICAL EXAMINATION:    BP 126/87 (BP Location: Left Arm, Patient Position: Sitting, Cuff Size: Normal)   Pulse 69   Wt 93 lb 9.6 oz (42.5 kg)   BMI 16.45 kg/m     General appearance: alert, cooperative and appears stated age Lymph:  no inguinal  LAD noted  Pelvic: External genitalia:  no lesions or abnormal skin changes              Urethra:  normal appearing urethra with no masses, tenderness or lesions              Bartholins and Skenes: normal                 Vagina: normal appearing vagina with normal color and discharge, no lesions              Chaperone, [name], [name], was present for exam.  Assessment/Plan: 1. Vaginal odor - Cervicovaginal ancillary only( [place])  2. Frequency of urination - POCT Urinalysis Dipstick.  This was negative so urine culture not sent.  3.  Vulvar irritation/sensitivity - skin is completely normal.  I am not sure of cause of symptoms.  Will try topical steroid to see if this helps. - pt has follow up Friday and will keep appt

## 2021-05-27 DIAGNOSIS — M25562 Pain in left knee: Secondary | ICD-10-CM | POA: Diagnosis not present

## 2021-05-27 DIAGNOSIS — H16101 Unspecified superficial keratitis, right eye: Secondary | ICD-10-CM | POA: Diagnosis not present

## 2021-05-27 LAB — CERVICOVAGINAL ANCILLARY ONLY
Bacterial Vaginitis (gardnerella): NEGATIVE
Candida Glabrata: NEGATIVE
Candida Vaginitis: NEGATIVE
Comment: NEGATIVE
Comment: NEGATIVE
Comment: NEGATIVE

## 2021-05-29 ENCOUNTER — Encounter (HOSPITAL_BASED_OUTPATIENT_CLINIC_OR_DEPARTMENT_OTHER): Payer: Self-pay | Admitting: Obstetrics & Gynecology

## 2021-05-29 ENCOUNTER — Ambulatory Visit (INDEPENDENT_AMBULATORY_CARE_PROVIDER_SITE_OTHER): Payer: PPO | Admitting: Obstetrics & Gynecology

## 2021-05-29 ENCOUNTER — Other Ambulatory Visit: Payer: Self-pay

## 2021-05-29 ENCOUNTER — Other Ambulatory Visit (HOSPITAL_COMMUNITY)
Admission: RE | Admit: 2021-05-29 | Discharge: 2021-05-29 | Disposition: A | Discharge status: Home / Self Care | Payer: PPO | Source: Ambulatory Visit | Attending: Obstetrics & Gynecology | Admitting: Obstetrics & Gynecology

## 2021-05-29 VITALS — BP 122/73 | HR 64 | Ht 64.0 in | Wt 95.6 lb

## 2021-05-29 DIAGNOSIS — Z23 Encounter for immunization: Secondary | ICD-10-CM

## 2021-05-29 DIAGNOSIS — Z01419 Encounter for gynecological examination (general) (routine) without abnormal findings: Secondary | ICD-10-CM | POA: Diagnosis not present

## 2021-05-29 DIAGNOSIS — Z780 Asymptomatic menopausal state: Secondary | ICD-10-CM | POA: Diagnosis not present

## 2021-05-29 DIAGNOSIS — Z1231 Encounter for screening mammogram for malignant neoplasm of breast: Secondary | ICD-10-CM

## 2021-05-29 DIAGNOSIS — M810 Age-related osteoporosis without current pathological fracture: Secondary | ICD-10-CM

## 2021-05-29 DIAGNOSIS — Z124 Encounter for screening for malignant neoplasm of cervix: Secondary | ICD-10-CM | POA: Diagnosis not present

## 2021-05-29 NOTE — Progress Notes (Signed)
65 y.o. Tammy Shields [name] here for breast and pelvic exam.  Was seen earlier this week for vulvar skin sensitivity. Testing for yeast/BV was negative.  Using topical steroid and this has helped some.  Pelvic floor exercises recommended as well.  Denies vaginal bleeding.  No LMP recorded. Patient is postmenopausal.          Sexually active: Yes.    H/O STD:  no  Health Maintenance: PCP:  Dr. Renold Genta.  Appt scheduled for 06/2021.  Will do blood work then.   Vaccines are up to date:  pt declines vaccinations today except tdap Colonoscopy:  09/07/2013 MMG:  09/25/2019 Negative BMD:  09/25/2019 Last pap smear:  10/08/2016 Negative.   H/o abnormal pap smear:  no    reports that she has never smoked. She has never used smokeless tobacco. She reports current alcohol use of about 8.0 - 10.0 standard drinks per week. She reports that she does not use drugs.  Past Medical History:  Diagnosis Date   Basal cell carcinoma 2008   Bulging of cervical intervertebral disc    Carpal tunnel syndrome on right    Finger numbness    Hymenoptera allergy    Osteoporosis    Postmenopausal    Raynaud's disease    self diagnosis    Past Surgical History:  Procedure Laterality Date   BIOPSY ENDOMETRIAL  2006   BREAST CYST ASPIRATION  12/02/1995    Current Outpatient Medications  Medication Sig Dispense Refill   b complex vitamins tablet Take 1 tablet by mouth daily.     Cholecalciferol (VITAMIN D) 1000 UNITS capsule Take 2,000 Units by mouth daily.     EPINEPHrine (EPIPEN 2-PAK) 0.3 mg/0.3 mL IJ SOAJ injection Use as directed for severe allergic reaction 2 each 2   ibuprofen (ADVIL,MOTRIN) 200 MG tablet Take 400 mg by mouth every 6 (six) hours as needed for mild pain. Hs prn     magnesium oxide (MAG-OX) 400 MG tablet Take 400 mg by mouth daily.     milk thistle 175 MG tablet Take 175 mg by mouth daily.     Multiple Vitamins-Minerals (CENTRUM SILVER ADULT 50+) TABS Take 1  tablet by mouth daily.     NON FORMULARY Ca(Life Extension Bone Restore with Vit K2)  Take 3 capsules po once daily.     tobramycin-dexamethasone (TOBRADEX) ophthalmic solution Place into the right eye.     triamcinolone ointment (KENALOG) 0.5 % Apply 1 application topically 2 (two) times daily. 30 g 0   Turmeric Curcumin 500 MG CAPS Take 500 mg by mouth daily.     vitamin C (ASCORBIC ACID) 500 MG tablet Take 1,000 mg by mouth daily.      zinc gluconate 50 MG tablet Take 50 mg by mouth daily.     co-enzyme Q-10 30 MG capsule Take 30 mg by mouth 3 (three) times daily. (Patient not taking: Reported on 05/29/2021)     No current facility-administered medications for this visit.    Family History  Problem Relation Age of Onset   Heart disease Mother    Diabetes Mother    Lung cancer Mother    Hypertension Mother    Prostate cancer Father    Hypertension Sister    Diabetes Brother     Review of Systems  All other systems reviewed and are negative.  Exam:   BP 122/73 (BP Location: Left Arm, Patient Position: Sitting, Cuff Size: Normal)   Pulse 64  Ht 5\' 4"  (1.626 m) Comment: reported  Wt 95 lb 9.6 oz (43.4 kg)   BMI 16.41 kg/m   Height: 5\' 4"  (162.6 cm) (reported)  General appearance: alert, cooperative and appears stated age Breasts: normal appearance, no masses or tenderness Abdomen: soft, non-tender; bowel sounds normal; no masses,  no organomegaly Lymph nodes: Cervical, supraclavicular, and axillary nodes normal.  No abnormal inguinal nodes palpated Neurologic: Grossly normal  Pelvic: External genitalia:  no lesions              Urethra:  normal appearing urethra with no masses, tenderness or lesions              Bartholins and Skenes: normal                 Vagina: normal appearing vagina with atrophic changes and no discharge, no lesions              Cervix: no lesions              Pap taken: Yes.   Bimanual Exam:  Uterus:  normal size, contour, position, consistency,  mobility, non-tender              Adnexa: normal adnexa and no mass, fullness, tenderness               Rectovaginal: Confirms               Anus:  normal sphincter tone, no lesions  Chaperone, [name], [name], was present for exam.  Assessment/Plan: 1. Encntr for gyn exam (general) (routine) w/o abn findings - pap smear obtained today - order for MMG placed - order for BMD placed - colonoscopy up to date - Dr. Renold Genta follows screening lab work - care gaps updated/reviewed.  Declines most vaccinations but did receive Tdap update today.  2. Age-related osteoporosis without current pathological fracture - DG BONE DENSITY (DXA); Future  3. Encounter for screening mammogram for malignant neoplasm of breast - MM 3D SCREEN BREAST BILATERAL; Future  4. Postmenopausal - no HRT  5.  Vulvar sensitivity/irritation - pt will give update next week

## 2021-06-01 ENCOUNTER — Ambulatory Visit (INDEPENDENT_AMBULATORY_CARE_PROVIDER_SITE_OTHER): Payer: PPO | Admitting: *Deleted

## 2021-06-01 ENCOUNTER — Other Ambulatory Visit: Payer: Self-pay

## 2021-06-01 DIAGNOSIS — T63441D Toxic effect of venom of bees, accidental (unintentional), subsequent encounter: Secondary | ICD-10-CM

## 2021-06-02 LAB — CYTOLOGY - PAP: Diagnosis: NEGATIVE

## 2021-06-11 DIAGNOSIS — M25562 Pain in left knee: Secondary | ICD-10-CM | POA: Diagnosis not present

## 2021-06-26 DIAGNOSIS — M25562 Pain in left knee: Secondary | ICD-10-CM | POA: Diagnosis not present

## 2021-07-03 ENCOUNTER — Encounter: Payer: PPO | Admitting: Internal Medicine

## 2021-07-07 ENCOUNTER — Other Ambulatory Visit: Payer: PPO | Admitting: Internal Medicine

## 2021-07-07 ENCOUNTER — Other Ambulatory Visit: Payer: Self-pay

## 2021-07-07 DIAGNOSIS — Z0000 Encounter for general adult medical examination without abnormal findings: Secondary | ICD-10-CM

## 2021-07-07 DIAGNOSIS — Z1329 Encounter for screening for other suspected endocrine disorder: Secondary | ICD-10-CM | POA: Diagnosis not present

## 2021-07-07 DIAGNOSIS — Z13220 Encounter for screening for lipoid disorders: Secondary | ICD-10-CM

## 2021-07-07 DIAGNOSIS — E785 Hyperlipidemia, unspecified: Secondary | ICD-10-CM | POA: Diagnosis not present

## 2021-07-08 DIAGNOSIS — M25562 Pain in left knee: Secondary | ICD-10-CM | POA: Diagnosis not present

## 2021-07-08 LAB — COMPLETE METABOLIC PANEL WITH GFR
AG Ratio: 2 (calc) (ref 1.0–2.5)
ALT: 21 U/L (ref 6–29)
AST: 26 U/L (ref 10–35)
Albumin: 4.5 g/dL (ref 3.6–5.1)
Alkaline phosphatase (APISO): 73 U/L (ref 37–153)
BUN/Creatinine Ratio: 43 (calc) — ABNORMAL HIGH (ref 6–22)
BUN: 30 mg/dL — ABNORMAL HIGH (ref 7–25)
CO2: 30 mmol/L (ref 20–32)
Calcium: 9.8 mg/dL (ref 8.6–10.4)
Chloride: 106 mmol/L (ref 98–110)
Creat: 0.69 mg/dL (ref 0.50–1.05)
Globulin: 2.2 g/dL (calc) (ref 1.9–3.7)
Glucose, Bld: 96 mg/dL (ref 65–99)
Potassium: 5.4 mmol/L — ABNORMAL HIGH (ref 3.5–5.3)
Sodium: 144 mmol/L (ref 135–146)
Total Bilirubin: 0.3 mg/dL (ref 0.2–1.2)
Total Protein: 6.7 g/dL (ref 6.1–8.1)
eGFR: 95 mL/min/{1.73_m2} (ref >=60)

## 2021-07-08 LAB — CBC WITH DIFFERENTIAL/PLATELET
Absolute Monocytes: 214 cells/uL (ref 200–950)
Basophils Absolute: 41 cells/uL (ref 0–200)
Basophils Relative: 1.7 %
Eosinophils Absolute: 50 cells/uL (ref 15–500)
Eosinophils Relative: 2.1 %
HCT: 39.1 % (ref 35.0–45.0)
Hemoglobin: 13.1 g/dL (ref 11.7–15.5)
Lymphs Abs: 914 cells/uL (ref 850–3900)
MCH: 32.3 pg (ref 27.0–33.0)
MCHC: 33.5 g/dL (ref 32.0–36.0)
MCV: 96.3 fL (ref 80.0–100.0)
MPV: 11.5 fL (ref 7.5–12.5)
Monocytes Relative: 8.9 %
Neutro Abs: 1181 cells/uL — ABNORMAL LOW (ref 1500–7800)
Neutrophils Relative %: 49.2 %
Platelets: 162 10*3/uL (ref 140–400)
RBC: 4.06 10*6/uL (ref 3.80–5.10)
RDW: 12.7 % (ref 11.0–15.0)
Total Lymphocyte: 38.1 %
WBC: 2.4 10*3/uL — ABNORMAL LOW (ref 3.8–10.8)

## 2021-07-08 LAB — LIPID PANEL
Cholesterol: 229 mg/dL — ABNORMAL HIGH (ref <200)
HDL: 105 mg/dL (ref >=50)
LDL Cholesterol (Calc): 111 mg/dL (calc) — ABNORMAL HIGH
Non-HDL Cholesterol (Calc): 124 mg/dL (calc) (ref <130)
Total CHOL/HDL Ratio: 2.2 (calc) (ref <5.0)
Triglycerides: 51 mg/dL (ref <150)

## 2021-07-08 LAB — TSH: TSH: 1.02 mIU/L (ref 0.40–4.50)

## 2021-07-10 ENCOUNTER — Encounter: Payer: Self-pay | Admitting: Internal Medicine

## 2021-07-10 ENCOUNTER — Ambulatory Visit (INDEPENDENT_AMBULATORY_CARE_PROVIDER_SITE_OTHER): Payer: PPO | Admitting: Internal Medicine

## 2021-07-10 ENCOUNTER — Other Ambulatory Visit: Payer: Self-pay

## 2021-07-10 VITALS — BP 114/68 | HR 68 | Temp 98.0°F | Ht 63.25 in | Wt 93.0 lb

## 2021-07-10 DIAGNOSIS — M818 Other osteoporosis without current pathological fracture: Secondary | ICD-10-CM

## 2021-07-10 DIAGNOSIS — D708 Other neutropenia: Secondary | ICD-10-CM

## 2021-07-10 DIAGNOSIS — Z91038 Other insect allergy status: Secondary | ICD-10-CM | POA: Diagnosis not present

## 2021-07-10 DIAGNOSIS — Z0000 Encounter for general adult medical examination without abnormal findings: Secondary | ICD-10-CM | POA: Diagnosis not present

## 2021-07-10 LAB — POCT URINALYSIS DIPSTICK
Bilirubin, UA: NEGATIVE
Blood, UA: NEGATIVE
Glucose, UA: NEGATIVE
Ketones, UA: NEGATIVE
Leukocytes, UA: NEGATIVE
Nitrite, UA: NEGATIVE
Protein, UA: NEGATIVE
Spec Grav, UA: 1.01 (ref 1.010–1.025)
Urobilinogen, UA: 0.2 E.U./dL
pH, UA: 5 (ref 5.0–8.0)

## 2021-07-10 NOTE — Progress Notes (Signed)
Annual Wellness Visit     Patient: EZMA REHM, DDS, Female    DOB: March 09, 1956, 65 y.o.   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  HPI She also presents for health maintenance exam and evaluation of medical issues.  Her general health is excellent.  She takes no chronic prescription medications.  She is allergic to insect stings and keeps EpiPen on hand.  History of benign neutropenia.  This dates back to 2007.  Has seen Hematologist for this in the past.  White blood cell count is 2400 and was 2600 last year.  She has a very high HDL cholesterol of 105.  Total cholesterol was 229 and LDL cholesterol is 111 and was 95 last year.  Triglycerides are 51.  History of Raynaud's phenomenon.  Has seen Dr. Scherrie Bateman for desensitization for insect sting allergy.  She did take Fosamax for osteopenia for period of time but discontinued it.  Bone density study has been ordered for May 2023.  T score of the spine in 2021 was -3.6.  She has not wanted to be on bone sparing medication.   Social History   Social History Narrative   Not on file  Married.  No children.  Enjoys running for exercise.  Non-smoker.  Rare alcohol consumption.  Patient Care Team: Elby Showers, MD as PCP - General (Internal Medicine)  Review of Systems no new complaints.  Generally feels well.  Has retired from Network engineer.  Stays active   Objective    Vitals: There were no vitals taken for this visit.  Physical Exam Vitals reviewed.  Constitutional:      General: She is not in acute distress.    Appearance: Normal appearance.  HENT:     Head: Normocephalic and atraumatic.     Right Ear: Tympanic membrane normal.     Left Ear: Tympanic membrane normal.     Nose: Nose normal.     Mouth/Throat:     Pharynx: Oropharynx is clear.  Eyes:      General: No scleral icterus.       Right eye: No discharge.        Left eye: No discharge.     Extraocular Movements: Extraocular movements intact.     Conjunctiva/sclera: Conjunctivae normal.     Pupils: Pupils are equal, round, and reactive to light.  Neck:     Vascular: No carotid bruit.  Cardiovascular:     Rate and Rhythm: Normal rate and regular rhythm.     Heart sounds: Normal heart sounds. No murmur heard. Pulmonary:     Breath sounds: Normal breath sounds. No wheezing or rales.  Abdominal:     Palpations: Abdomen is soft.     Tenderness: There is no abdominal tenderness. There is no guarding or rebound.  Genitourinary:    Comments: Bimanual normal Pap deferred Musculoskeletal:     Cervical back: Neck supple. No rigidity.     Right lower leg: No edema.     Left lower leg: No edema.  Skin:    General: Skin is warm and dry.  Neurological:     General: No focal deficit present.     Mental Status: She is alert and oriented to person, place, and time.     Cranial Nerves: No cranial nerve deficit.  Sensory: No sensory deficit.     Motor: No weakness.     Gait: Gait normal.     Deep Tendon Reflexes: Reflexes normal.  Psychiatric:        Mood and Affect: Mood normal.        Behavior: Behavior normal.        Thought Content: Thought content normal.        Judgment: Judgment normal.   Blood pressure 114/68 pulse 68 temperature 98 degrees pulse oximetry 99% weight 93 pounds height 5 feet 3.25 inches BMI 16.34   Most recent functional status assessment: In your present state of health, do you have any difficulty performing the following activities: 07/10/2021  Hearing? N  Vision? N  Difficulty concentrating or making decisions? N  Walking or climbing stairs? N  Dressing or bathing? N  Doing errands, shopping? N  Preparing Food and eating ? N  Using the Toilet? N  In the past six months, have you accidently leaked urine? N  Do you have problems with loss of bowel  control? N  Managing your Medications? N  Managing your Finances? N  Housekeeping or managing your Housekeeping? N  Some recent data might be hidden   Most recent fall risk assessment: Fall Risk  07/10/2021  Falls in the past year? 0  Number falls in past yr: 0  Injury with Fall? 0  Risk for fall due to : No Fall Risks  Follow up Falls evaluation completed    Most recent depression screenings: PHQ 2/9 Scores 07/10/2021 05/26/2021  PHQ - 2 Score 0 0  Exception Documentation - -   Most recent cognitive screening: 6CIT Screen 07/10/2021  What Year? 0 points  What month? 0 points  What time? 0 points  Count back from 20 0 points  Months in reverse 0 points  Repeat phrase 0 points  Total Score 0       Assessment & Plan      BMI 16-patient feels well  Osteoporosis-does not want to be on Prolia or other bone sparing therapy.  Continue vitamin D supplementation  Benign neutropenia dates back to 2007 and stable  Insect sting allergy-has seen allergist  Plan: Return in 1 year or as needed.  Advanced directives on file     Annual wellness visit done today including the all of the following: Reviewed patient's Family Medical History Reviewed and updated list of patient's medical providers Assessment of cognitive impairment was done Assessed patient's functional ability Established a written schedule for health screening [place] Completed and Reviewed  Discussed health benefits of physical activity, and encouraged her to engage in regular exercise appropriate for her age and condition.         IElby Showers, MD, have reviewed all documentation for this visit. The documentation on 08/22/21 for the exam, diagnosis, procedures, and orders are all accurate and complete.   Angus Seller, [name]

## 2021-07-14 DIAGNOSIS — M25562 Pain in left knee: Secondary | ICD-10-CM | POA: Diagnosis not present

## 2021-07-28 DIAGNOSIS — M25562 Pain in left knee: Secondary | ICD-10-CM | POA: Diagnosis not present

## 2021-08-04 DIAGNOSIS — M25562 Pain in left knee: Secondary | ICD-10-CM | POA: Diagnosis not present

## 2021-08-11 DIAGNOSIS — M25562 Pain in left knee: Secondary | ICD-10-CM | POA: Diagnosis not present

## 2021-08-18 DIAGNOSIS — M25562 Pain in left knee: Secondary | ICD-10-CM | POA: Diagnosis not present

## 2021-08-22 NOTE — Patient Instructions (Signed)
It was a pleasure to see you today.  Labs are stable.  Return in 1 year.

## 2021-08-24 ENCOUNTER — Other Ambulatory Visit: Payer: Self-pay

## 2021-08-24 ENCOUNTER — Ambulatory Visit (INDEPENDENT_AMBULATORY_CARE_PROVIDER_SITE_OTHER): Payer: PPO

## 2021-08-24 DIAGNOSIS — T63441D Toxic effect of venom of bees, accidental (unintentional), subsequent encounter: Secondary | ICD-10-CM | POA: Diagnosis not present

## 2021-08-25 DIAGNOSIS — M25562 Pain in left knee: Secondary | ICD-10-CM | POA: Diagnosis not present

## 2021-08-26 DIAGNOSIS — Z0289 Encounter for other administrative examinations: Secondary | ICD-10-CM

## 2021-09-01 DIAGNOSIS — M25562 Pain in left knee: Secondary | ICD-10-CM | POA: Diagnosis not present

## 2021-09-07 ENCOUNTER — Ambulatory Visit (INDEPENDENT_AMBULATORY_CARE_PROVIDER_SITE_OTHER): Payer: PPO | Admitting: Internal Medicine

## 2021-09-07 ENCOUNTER — Encounter: Payer: Self-pay | Admitting: Internal Medicine

## 2021-09-07 ENCOUNTER — Telehealth: Payer: Self-pay | Admitting: Internal Medicine

## 2021-09-07 ENCOUNTER — Other Ambulatory Visit: Payer: Self-pay

## 2021-09-07 VITALS — BP 112/80 | HR 74 | Temp 98.0°F

## 2021-09-07 DIAGNOSIS — J3489 Other specified disorders of nose and nasal sinuses: Secondary | ICD-10-CM

## 2021-09-07 DIAGNOSIS — J22 Unspecified acute lower respiratory infection: Secondary | ICD-10-CM | POA: Diagnosis not present

## 2021-09-07 DIAGNOSIS — J069 Acute upper respiratory infection, unspecified: Secondary | ICD-10-CM

## 2021-09-07 DIAGNOSIS — R509 Fever, unspecified: Secondary | ICD-10-CM

## 2021-09-07 LAB — POCT INFLUENZA A/B
Influenza A, POC: NEGATIVE
Influenza B, POC: NEGATIVE

## 2021-09-07 LAB — POC COVID19 BINAXNOW: SARS Coronavirus 2 Ag: NEGATIVE

## 2021-09-07 NOTE — Telephone Encounter (Signed)
scheduled

## 2021-09-07 NOTE — Telephone Encounter (Signed)
Tammy Shields [phone]  [name] called to say she had been running low grade fever 99.3, for couple days, sneezing, had some drainage, itchy nose, no cough. COVID test is negative. She took Advil over weekend, so far this morning no fever or drainage.

## 2021-09-07 NOTE — Progress Notes (Signed)
° °  Subjective:    Patient ID: Tammy Shields, DDS, female    DOB: January 21, 1956, 66 y.o.   MRN: [id number]  HPI 66 year old Female seen with low-grade fever, sneezing, respiratory congestion but no cough.  COVID test at home was negative.  Records indicates she has had 3 COVID vaccines in 2021.  She is careful about exposure.  She is a retired Pharmacist, community.  Exercises and stays in shape.  General health is good.    Review of Systems no nausea vomiting or diarrhea     Objective:   Physical Exam Temperature 98 degrees pulse 74 regular blood pressure 112/80 pulse oximetry 99%  Skin: Warm and dry.  TMs are slightly full bilaterally.  TMs are not red.  Pharynx is clear.  Neck is supple.  Chest clear.  Respiratory virus panel obtained and results are pending.  Rapid COVID and rapid flu tests are negative today in the office.     Assessment & Plan:   Acute viral illness  Plan: COVID test at home was negative.  Patient will rest at home and hydrate well.  She will call if symptoms worsen.  Await results of respiratory virus panel.  Call if symptoms worsen or not improving within 48 hours.  Monitor temperature frequently.  May take Sudafed if you have ear congestion.

## 2021-09-07 NOTE — Patient Instructions (Signed)
Have performed Respiratory virus panel, Rapid covid and Rapid flu tests.  I think you have a viral respiratory infection. Rest and drink fluids. Monitor temp.  Stay well hydrated.You have a mild bilateral serous otitis media and can take Sudfed if you desire.

## 2021-09-07 NOTE — Telephone Encounter (Signed)
Yes COVID test was negative today and tempeture is starting to go up this morning.

## 2021-09-08 DIAGNOSIS — M25562 Pain in left knee: Secondary | ICD-10-CM | POA: Diagnosis not present

## 2021-09-10 LAB — RESPIRATORY VIRUS PANEL

## 2021-09-15 DIAGNOSIS — M25562 Pain in left knee: Secondary | ICD-10-CM | POA: Diagnosis not present

## 2021-09-22 DIAGNOSIS — M25562 Pain in left knee: Secondary | ICD-10-CM | POA: Diagnosis not present

## 2021-09-29 ENCOUNTER — Ambulatory Visit
Admission: RE | Admit: 2021-09-29 | Discharge: 2021-09-29 | Disposition: A | Discharge status: Home / Self Care | Payer: PPO | Source: Ambulatory Visit | Attending: Obstetrics & Gynecology | Admitting: Obstetrics & Gynecology

## 2021-09-29 DIAGNOSIS — Z1231 Encounter for screening mammogram for malignant neoplasm of breast: Secondary | ICD-10-CM

## 2021-09-29 DIAGNOSIS — M25562 Pain in left knee: Secondary | ICD-10-CM | POA: Diagnosis not present

## 2021-10-13 DIAGNOSIS — M25562 Pain in left knee: Secondary | ICD-10-CM | POA: Diagnosis not present

## 2021-10-14 DIAGNOSIS — R636 Underweight: Secondary | ICD-10-CM | POA: Diagnosis not present

## 2021-10-14 DIAGNOSIS — M1990 Unspecified osteoarthritis, unspecified site: Secondary | ICD-10-CM | POA: Diagnosis not present

## 2021-10-14 DIAGNOSIS — G5601 Carpal tunnel syndrome, right upper limb: Secondary | ICD-10-CM | POA: Diagnosis not present

## 2021-10-14 DIAGNOSIS — D709 Neutropenia, unspecified: Secondary | ICD-10-CM | POA: Diagnosis not present

## 2021-10-14 DIAGNOSIS — M810 Age-related osteoporosis without current pathological fracture: Secondary | ICD-10-CM | POA: Diagnosis not present

## 2021-10-14 DIAGNOSIS — I7300 Raynaud's syndrome without gangrene: Secondary | ICD-10-CM | POA: Diagnosis not present

## 2021-10-23 DIAGNOSIS — M25562 Pain in left knee: Secondary | ICD-10-CM | POA: Diagnosis not present

## 2021-10-28 DIAGNOSIS — M25562 Pain in left knee: Secondary | ICD-10-CM | POA: Diagnosis not present

## 2021-11-05 DIAGNOSIS — M25562 Pain in left knee: Secondary | ICD-10-CM | POA: Diagnosis not present

## 2021-11-12 DIAGNOSIS — M25562 Pain in left knee: Secondary | ICD-10-CM | POA: Diagnosis not present

## 2021-11-16 ENCOUNTER — Ambulatory Visit (INDEPENDENT_AMBULATORY_CARE_PROVIDER_SITE_OTHER): Payer: PPO

## 2021-11-16 DIAGNOSIS — T63441D Toxic effect of venom of bees, accidental (unintentional), subsequent encounter: Secondary | ICD-10-CM | POA: Diagnosis not present

## 2021-11-19 DIAGNOSIS — M25562 Pain in left knee: Secondary | ICD-10-CM | POA: Diagnosis not present

## 2021-11-24 ENCOUNTER — Ambulatory Visit
Admission: RE | Admit: 2021-11-24 | Discharge: 2021-11-24 | Disposition: A | Discharge status: Home / Self Care | Payer: PPO | Source: Ambulatory Visit | Attending: Obstetrics & Gynecology | Admitting: Obstetrics & Gynecology

## 2021-11-24 DIAGNOSIS — Z780 Asymptomatic menopausal state: Secondary | ICD-10-CM | POA: Diagnosis not present

## 2021-11-24 DIAGNOSIS — M810 Age-related osteoporosis without current pathological fracture: Secondary | ICD-10-CM | POA: Diagnosis not present

## 2021-12-08 DIAGNOSIS — H2513 Age-related nuclear cataract, bilateral: Secondary | ICD-10-CM | POA: Diagnosis not present

## 2021-12-08 DIAGNOSIS — H52203 Unspecified astigmatism, bilateral: Secondary | ICD-10-CM | POA: Diagnosis not present

## 2021-12-21 DIAGNOSIS — M25562 Pain in left knee: Secondary | ICD-10-CM | POA: Diagnosis not present

## 2021-12-31 DIAGNOSIS — M25562 Pain in left knee: Secondary | ICD-10-CM | POA: Diagnosis not present

## 2022-01-07 DIAGNOSIS — M25562 Pain in left knee: Secondary | ICD-10-CM | POA: Diagnosis not present

## 2022-01-28 DIAGNOSIS — M25562 Pain in left knee: Secondary | ICD-10-CM | POA: Diagnosis not present

## 2022-02-02 DIAGNOSIS — M25562 Pain in left knee: Secondary | ICD-10-CM | POA: Diagnosis not present

## 2022-02-08 ENCOUNTER — Ambulatory Visit (INDEPENDENT_AMBULATORY_CARE_PROVIDER_SITE_OTHER): Payer: PPO

## 2022-02-08 DIAGNOSIS — T63441D Toxic effect of venom of bees, accidental (unintentional), subsequent encounter: Secondary | ICD-10-CM | POA: Diagnosis not present

## 2022-02-10 DIAGNOSIS — M25562 Pain in left knee: Secondary | ICD-10-CM | POA: Diagnosis not present

## 2022-03-11 DIAGNOSIS — M25562 Pain in left knee: Secondary | ICD-10-CM | POA: Diagnosis not present

## 2022-03-19 DIAGNOSIS — M25562 Pain in left knee: Secondary | ICD-10-CM | POA: Diagnosis not present

## 2022-04-01 DIAGNOSIS — M25562 Pain in left knee: Secondary | ICD-10-CM | POA: Diagnosis not present

## 2022-04-08 DIAGNOSIS — M25562 Pain in left knee: Secondary | ICD-10-CM | POA: Diagnosis not present

## 2022-04-15 DIAGNOSIS — M25562 Pain in left knee: Secondary | ICD-10-CM | POA: Diagnosis not present

## 2022-04-21 DIAGNOSIS — M25562 Pain in left knee: Secondary | ICD-10-CM | POA: Diagnosis not present

## 2022-04-29 DIAGNOSIS — M25562 Pain in left knee: Secondary | ICD-10-CM | POA: Diagnosis not present

## 2022-05-03 ENCOUNTER — Ambulatory Visit (INDEPENDENT_AMBULATORY_CARE_PROVIDER_SITE_OTHER): Payer: PPO

## 2022-05-03 DIAGNOSIS — T63441D Toxic effect of venom of bees, accidental (unintentional), subsequent encounter: Secondary | ICD-10-CM

## 2022-05-06 DIAGNOSIS — M25562 Pain in left knee: Secondary | ICD-10-CM | POA: Diagnosis not present

## 2022-05-13 DIAGNOSIS — M25562 Pain in left knee: Secondary | ICD-10-CM | POA: Diagnosis not present

## 2022-05-20 DIAGNOSIS — M25562 Pain in left knee: Secondary | ICD-10-CM | POA: Diagnosis not present

## 2022-06-21 DIAGNOSIS — M542 Cervicalgia: Secondary | ICD-10-CM | POA: Diagnosis not present

## 2022-06-24 ENCOUNTER — Telehealth: Payer: Self-pay | Admitting: Internal Medicine

## 2022-06-24 ENCOUNTER — Encounter: Payer: Self-pay | Admitting: Internal Medicine

## 2022-06-24 ENCOUNTER — Telehealth (INDEPENDENT_AMBULATORY_CARE_PROVIDER_SITE_OTHER): Payer: PPO | Admitting: Internal Medicine

## 2022-06-24 VITALS — Temp 101.7°F | Wt 95.0 lb

## 2022-06-24 DIAGNOSIS — U071 COVID-19: Secondary | ICD-10-CM

## 2022-06-24 NOTE — Telephone Encounter (Signed)
scheduled

## 2022-06-24 NOTE — Telephone Encounter (Signed)
Dr Vivia Ewing [phone]  Dr Ennis Forts just called she started felling not right yesterday with little scratchy throat yesterday, woke up this morning with body aches and fever 101.4, and headache, she just done a COVID test and it was positive. Do you want to do Video visit?

## 2022-06-24 NOTE — Progress Notes (Signed)
   Subjective:    Patient ID: Tammy Shields, DDS, female    DOB: 09-Sep-1955, 66 y.o.   MRN: [id number]  HPI 66 year old Female seen today via interactive audio and video telecommunications.  She is identified using 2 identifiers as Dr. Wells Guiles L. Gilden, a patient in this practice.  She is at her home and I am at my office.  She is agreeable to visit in this format today.  Patient reports that she awakened yesterday morning with some malaise.  She exercises regularly at a couple of gyms.  She improved some as the day went on.  This morning she woke up with myalgias, temperature was 101.4 degrees.  She took some ibuprofen early in the morning temperature decreased but now is back up to 101.7.  She has no nausea, vomiting or shaking chills.  Her COVID test today is positive.  She is seen virtually and is able to give a clear concise history.  Her general health is excellent.  She takes no chronic prescription medications.  Review of Systems see above-feeling better after taking ibuprofen.     Objective:   Physical Exam She is alert, not heard to be coughing on video visit at this point.  Is not tachypneic when speaking.       Assessment & Plan:  Acute COVID-19 virus infection  Plan: We discussed Paxlovid as an option.  She prefers not to take Paxlovid at this time.  She thinks she may check another COVID test tomorrow which is fine.  If indeed she does have COVID-19, which I suspect she does, she needs to quarantine at home for 5 days.  She is to rest, stay well-hydrated and monitor for respiratory issues.  It is fine to call me back if she has any questions or concerns.

## 2022-06-24 NOTE — Patient Instructions (Signed)
We are sorry you are not feeling well.  I would take another COVID test in the morning to confirm your diagnosis.  Rest, stay well-hydrated May take ibuprofen for fever and myalgias.  Call if you have any questions or concerns.  Quarantine for 5 days.

## 2022-07-09 ENCOUNTER — Other Ambulatory Visit: Payer: PPO

## 2022-07-09 DIAGNOSIS — Z13220 Encounter for screening for lipoid disorders: Secondary | ICD-10-CM | POA: Diagnosis not present

## 2022-07-09 DIAGNOSIS — Z0000 Encounter for general adult medical examination without abnormal findings: Secondary | ICD-10-CM | POA: Diagnosis not present

## 2022-07-09 DIAGNOSIS — R5383 Other fatigue: Secondary | ICD-10-CM | POA: Diagnosis not present

## 2022-07-09 DIAGNOSIS — E782 Mixed hyperlipidemia: Secondary | ICD-10-CM | POA: Diagnosis not present

## 2022-07-09 DIAGNOSIS — Z136 Encounter for screening for cardiovascular disorders: Secondary | ICD-10-CM

## 2022-07-09 DIAGNOSIS — M542 Cervicalgia: Secondary | ICD-10-CM | POA: Diagnosis not present

## 2022-07-10 LAB — CBC WITH DIFFERENTIAL/PLATELET
Absolute Monocytes: 190 cells/uL — ABNORMAL LOW (ref 200–950)
Basophils Absolute: 31 cells/uL (ref 0–200)
Basophils Relative: 0.9 %
Eosinophils Absolute: 51 cells/uL (ref 15–500)
Eosinophils Relative: 1.5 %
HCT: 38.5 % (ref 35.0–45.0)
Hemoglobin: 13.1 g/dL (ref 11.7–15.5)
Lymphs Abs: 1119 cells/uL (ref 850–3900)
MCH: 32.3 pg (ref 27.0–33.0)
MCHC: 34 g/dL (ref 32.0–36.0)
MCV: 95.1 fL (ref 80.0–100.0)
MPV: 10.8 fL (ref 7.5–12.5)
Monocytes Relative: 5.6 %
Neutro Abs: 2009 cells/uL (ref 1500–7800)
Neutrophils Relative %: 59.1 %
Platelets: 269 10*3/uL (ref 140–400)
RBC: 4.05 10*6/uL (ref 3.80–5.10)
RDW: 12.1 % (ref 11.0–15.0)
Total Lymphocyte: 32.9 %
WBC: 3.4 10*3/uL — ABNORMAL LOW (ref 3.8–10.8)

## 2022-07-10 LAB — TSH: TSH: 1 mIU/L (ref 0.40–4.50)

## 2022-07-10 LAB — LIPID PANEL
Cholesterol: 196 mg/dL (ref <200)
HDL: 82 mg/dL (ref >=50)
LDL Cholesterol (Calc): 99 mg/dL (calc)
Non-HDL Cholesterol (Calc): 114 mg/dL (calc) (ref <130)
Total CHOL/HDL Ratio: 2.4 (calc) (ref <5.0)
Triglycerides: 60 mg/dL (ref <150)

## 2022-07-10 LAB — COMPLETE METABOLIC PANEL WITH GFR
AG Ratio: 2.3 (calc) (ref 1.0–2.5)
ALT: 17 U/L (ref 6–29)
AST: 24 U/L (ref 10–35)
Albumin: 4.4 g/dL (ref 3.6–5.1)
Alkaline phosphatase (APISO): 67 U/L (ref 37–153)
BUN: 24 mg/dL (ref 7–25)
CO2: 30 mmol/L (ref 20–32)
Calcium: 9.3 mg/dL (ref 8.6–10.4)
Chloride: 104 mmol/L (ref 98–110)
Creat: 0.65 mg/dL (ref 0.50–1.05)
Globulin: 1.9 g/dL (calc) (ref 1.9–3.7)
Glucose, Bld: 88 mg/dL (ref 65–99)
Potassium: 4.5 mmol/L (ref 3.5–5.3)
Sodium: 142 mmol/L (ref 135–146)
Total Bilirubin: 0.5 mg/dL (ref 0.2–1.2)
Total Protein: 6.3 g/dL (ref 6.1–8.1)
eGFR: 96 mL/min/{1.73_m2} (ref >=60)

## 2022-07-14 ENCOUNTER — Ambulatory Visit (INDEPENDENT_AMBULATORY_CARE_PROVIDER_SITE_OTHER): Payer: PPO | Admitting: Internal Medicine

## 2022-07-14 ENCOUNTER — Encounter: Payer: Self-pay | Admitting: Internal Medicine

## 2022-07-14 VITALS — BP 120/74 | HR 69 | Temp 98.0°F | Ht 63.75 in | Wt 94.8 lb

## 2022-07-14 DIAGNOSIS — M810 Age-related osteoporosis without current pathological fracture: Secondary | ICD-10-CM | POA: Diagnosis not present

## 2022-07-14 DIAGNOSIS — Z0000 Encounter for general adult medical examination without abnormal findings: Secondary | ICD-10-CM

## 2022-07-14 DIAGNOSIS — D708 Other neutropenia: Secondary | ICD-10-CM

## 2022-07-14 DIAGNOSIS — M818 Other osteoporosis without current pathological fracture: Secondary | ICD-10-CM | POA: Diagnosis not present

## 2022-07-14 LAB — POCT URINALYSIS DIPSTICK
Bilirubin, UA: NEGATIVE
Blood, UA: NEGATIVE
Glucose, UA: NEGATIVE
Ketones, UA: POSITIVE
Leukocytes, UA: NEGATIVE
Nitrite, UA: NEGATIVE
Protein, UA: NEGATIVE
Spec Grav, UA: 1.015 (ref 1.010–1.025)
Urobilinogen, UA: 0.2 E.U./dL
pH, UA: 5 (ref 5.0–8.0)

## 2022-07-14 NOTE — Progress Notes (Signed)
Subjective:    Patient ID: Tammy Shields, DDS, female    DOB: August 31, 1955, 66 y.o.   MRN: [id number]  HPI  66 year old Female seen for Medicare wellness, health maintenance exam and evaluation of medical issues.  Her general health is excellent.  She takes no chronic medications.  She is allergic to insect stings and has an EpiPen on hand.  She exercises a great deal.  History of benign neutropenia and has seen hematologist for consultation regarding this.  History of Raynaud's phenomenon.  Has seen Dr. Scherrie Shields for desensitization for insect sting allergy.  She did take Fosamax for osteopenia 4.  Tablet discontinued it.  History of bruxism.  History of torn labrum left shoulder in 2006.  Lowest T-score on bone density study March 2021 was -3.6.  This is consistent with osteoporosis and Prolia was discussed with her but she has not wanted to pursue that.  Had colonoscopy in 2015 by Dr. Fuller Shields with 10-year follow-up recommended.  Social history: Married.  No children.  Husband is a retired Chief Financial Officer.  She is a retired Pharmacist, community.  Family history: Father died at age 66 with prostate cancer.  Mother died at age 94 of cardiac arrest.  Mother with history of diabetes, hypertension and cancer.  1 brother with diabetes.  1 sister in good health.    Review of Systems no new complaints     Objective:   Physical Exam Blood pressure 120/74, pulse 69 ,temperature 98 degrees by tympanic thermometer, pulse oximetry 98%, Weight 94 pounds 12.8 ounces, BMI 16.40, Height 5 feet 3.75 in  Skin warm and dry.  No cervical adenopathy.  No thyromegaly.  No carotid bruits.  Chest clear to auscultation without rales or wheezing.  Cardiac exam: Regular rate and rhythm without murmur or ectopy.  Abdomen: No hepatosplenomegaly masses or tenderness.    No lower extremity edema.  Brief neurological exam intact without gross focal deficits.  Affect thought and judgment are normal.  GYN exam deferred to Dr. Albin Shields      Assessment & Shields:   Normal health maintenance exam.  Labs reviewed and are within normal limits with exception of white blood cell count of 3400.  Longstanding history of benign neutropenia.  Last year she had mild elevation of LDL at 111.  This year it is within normal limits.  She has an excellent HDL at 82.  Triglycerides are normal at 60.  Total cholesterol is 196.  C-Met is normal and TSH is normal.  Urine dipstick is normal.  Referral to [facility] Endocrinology for bone density management.  Return in 1 year or as needed.  Bone density study in May 2023 consistent with osteoporosis with T-score of -3.7.  Tetanus immunization is up-to-date.  Other vaccines mentioned and she will consider.  Last COVID-vaccines on file were done 2021.   Subjective:   Patient presents for Medicare Annual/Subsequent preventive examination.  Review Past Medical/Family/Social: See above   Risk Factors  Current exercise habits: Exercises a great deal Dietary issues discussed: Yes  Cardiac risk factors: None  Depression Screen  (Note: if answer to either of the following is "Yes", a more complete depression screening is indicated)   Over the past two weeks, have you felt down, depressed or hopeless? No  Over the past two weeks, have you felt little interest or pleasure in doing things? No Have you lost interest or pleasure in daily life? No Do you often feel hopeless? No Do you cry easily over  simple problems? No   Activities of Daily Living  In your present state of health, do you have any difficulty performing the following activities?:   Driving? No  Managing money? No  Feeding yourself? No  Getting from bed to chair? No  Climbing a flight of stairs? No  Preparing food and eating?: No  Bathing or showering? No  Getting dressed: No  Getting to the toilet? No  Using the toilet:No  Moving around from place to place: No  In the past year have you fallen or had a near fall?:No  Are you sexually  active? yes Do you have more than one partner? No   Hearing Difficulties: No  Do you often ask people to speak up or repeat themselves? No  Do you experience ringing or noises in your ears? No  Do you have difficulty understanding soft or whispered voices? No  Do you feel that you have a problem with memory? No Do you often misplace items? No    Home Safety:  Do you have a smoke alarm at your residence? Yes    Cognitive Testing  Alert? Yes Normal Appearance?Yes  Oriented to person? Yes Place? Yes  Time? Yes  Recall of three objects? Yes  Can perform simple calculations? Yes  Displays appropriate judgment?Yes  Can read the correct time from a watch face?Yes   List the Names of Other Physician/Practitioners you currently use:  See referral list for the physicians patient is currently seeing.  Dr. Gerald Shields, GYN   Review of Systems: See above   Objective:     General appearance: Appears younger than stated age and thin Head: Normocephalic, without obvious abnormality, atraumatic  Eyes: conj clear, EOMi PEERLA  Ears: normal TM's and external ear canals both ears  Nose: Nares normal. Septum midline. Mucosa normal. No drainage or sinus tenderness.  Throat: lips, mucosa, and tongue normal; teeth and gums normal  Neck: no adenopathy, no carotid bruit, no JVD, supple, symmetrical, trachea midline and thyroid not enlarged, symmetric, no tenderness/mass/nodules  No CVA tenderness.  Lungs: clear to auscultation bilaterally  Breasts: normal appearance- not much breast tissue Heart: regular rate and rhythm, S1, S2 normal, no murmur, click, rub or gallop  Abdomen: soft, non-tender; bowel sounds normal; no masses, no organomegaly  Musculoskeletal: ROM normal in all joints, no crepitus, no deformity, Normal muscle strengthen. Back  is symmetric, no curvature. Skin: Skin color, texture, turgor normal. No rashes or lesions  Lymph nodes: Cervical, supraclavicular, and axillary nodes  normal.  Neurologic: CN 2 -12 Normal, Normal symmetric reflexes. Normal coordination and gait  Psych: Alert & Oriented x 3, Mood appear stable.    Assessment:    Annual wellness medicare exam   Shields:    During the course of the visit the patient was educated and counseled about appropriate screening and preventive services including:        Patient Instructions (the written Shields) was given to the patient.  Medicare Attestation  I have personally reviewed:  [name] medical and social history  Their use of alcohol, tobacco or illicit drugs  Their current medications and supplements  [name] functional ability including ADLs,fall risks, home safety risks, cognitive, and hearing and visual impairment  Diet and physical activities  Evidence for depression or mood disorders  [name] weight, height, BMI, and visual acuity have been recorded in the chart. I have made referrals, counseling, and provided education to the patient based on review of the above and I  have provided the patient with a written personalized care Shields for preventive services.

## 2022-07-14 NOTE — Patient Instructions (Addendum)
Referral to [facility] Endocrinology for bone density evaluation. RTC in one year or as needed.  It was a pleasure to see you today.

## 2022-07-20 ENCOUNTER — Encounter: Payer: Self-pay | Admitting: Internal Medicine

## 2022-07-22 DIAGNOSIS — M542 Cervicalgia: Secondary | ICD-10-CM | POA: Diagnosis not present

## 2022-07-27 ENCOUNTER — Ambulatory Visit: Payer: PPO

## 2022-07-27 ENCOUNTER — Ambulatory Visit (INDEPENDENT_AMBULATORY_CARE_PROVIDER_SITE_OTHER): Payer: PPO

## 2022-07-27 DIAGNOSIS — T63441D Toxic effect of venom of bees, accidental (unintentional), subsequent encounter: Secondary | ICD-10-CM | POA: Diagnosis not present

## 2022-07-29 DIAGNOSIS — M542 Cervicalgia: Secondary | ICD-10-CM | POA: Diagnosis not present

## 2022-07-30 DIAGNOSIS — E782 Mixed hyperlipidemia: Secondary | ICD-10-CM | POA: Insufficient documentation

## 2022-08-04 DIAGNOSIS — M542 Cervicalgia: Secondary | ICD-10-CM | POA: Diagnosis not present

## 2022-08-12 DIAGNOSIS — M542 Cervicalgia: Secondary | ICD-10-CM | POA: Diagnosis not present

## 2022-08-21 DIAGNOSIS — M810 Age-related osteoporosis without current pathological fracture: Secondary | ICD-10-CM | POA: Insufficient documentation

## 2022-08-26 DIAGNOSIS — M542 Cervicalgia: Secondary | ICD-10-CM | POA: Diagnosis not present

## 2022-09-02 DIAGNOSIS — H00014 Hordeolum externum left upper eyelid: Secondary | ICD-10-CM | POA: Diagnosis not present

## 2022-09-07 DIAGNOSIS — H0015 Chalazion left lower eyelid: Secondary | ICD-10-CM | POA: Diagnosis not present

## 2022-09-09 DIAGNOSIS — M542 Cervicalgia: Secondary | ICD-10-CM | POA: Diagnosis not present

## 2022-09-21 DIAGNOSIS — H0014 Chalazion left upper eyelid: Secondary | ICD-10-CM | POA: Diagnosis not present

## 2022-09-23 DIAGNOSIS — M542 Cervicalgia: Secondary | ICD-10-CM | POA: Diagnosis not present

## 2022-10-06 DIAGNOSIS — M542 Cervicalgia: Secondary | ICD-10-CM | POA: Diagnosis not present

## 2022-10-18 ENCOUNTER — Ambulatory Visit: Payer: PPO

## 2022-10-18 ENCOUNTER — Ambulatory Visit (INDEPENDENT_AMBULATORY_CARE_PROVIDER_SITE_OTHER): Payer: PPO | Admitting: *Deleted

## 2022-10-18 DIAGNOSIS — T63441D Toxic effect of venom of bees, accidental (unintentional), subsequent encounter: Secondary | ICD-10-CM | POA: Diagnosis not present

## 2022-10-19 ENCOUNTER — Ambulatory Visit (INDEPENDENT_AMBULATORY_CARE_PROVIDER_SITE_OTHER): Payer: PPO | Admitting: Allergy and Immunology

## 2022-10-19 VITALS — BP 114/78 | HR 65 | Temp 98.7°F | Resp 16 | Ht 63.74 in | Wt 94.3 lb

## 2022-10-19 DIAGNOSIS — T782XXD Anaphylactic shock, unspecified, subsequent encounter: Secondary | ICD-10-CM

## 2022-10-19 DIAGNOSIS — T63481D Toxic effect of venom of other arthropod, accidental (unintentional), subsequent encounter: Secondary | ICD-10-CM | POA: Diagnosis not present

## 2022-10-19 DIAGNOSIS — M542 Cervicalgia: Secondary | ICD-10-CM | POA: Diagnosis not present

## 2022-10-19 MED ORDER — EPINEPHRINE 0.3 MG/0.3ML IJ SOAJ: 0.3000 mg | INTRAMUSCULAR | 1 refills | Status: DC | PRN

## 2022-10-19 NOTE — Patient Instructions (Signed)
  1.  Continue immunotherapy directed against mixed vespid and wasp  2.  Continue EpiPen if needed  3.  Return to clinic in 1 year or earlier if problem

## 2022-10-19 NOTE — Progress Notes (Unsigned)
[place] [facility] [place] [facility] [place]   Follow-up Note  Referring Provider: Elby Showers, MD Primary Provider: Elby Showers, MD Date of Office Visit: 10/19/2022  Subjective:   Tammy Shields, DDS (DOB: 04/21/1956) is a 67 y.o. 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  I last saw her in this clinic 17 March 2021.  She continues on immunotherapy directed against hymenoptera venom every 12 weeks without adverse effect.  She has an injectable epinephrine device.  She has had a few new developments.  She describes this pruritic disorder that is perinasal and perioral that is a very intermittent issue and can be quiescent for a month and then show up for period in time that she treats with application of Chapstick around that area and uses a nasal gel which appears to result in pretty good control of this issue.  She is also been having recurrent chalazions involving her left eye under the care of an ophthalmologist.  Recently she had some form of pustular dermatitis that involved her malar region and around her eye but fortunately this appears to be improving.  She has developed what sounds like some issues with phantosmia without any associated systemic or constitutional symptoms.  Allergies as of 10/19/2022       Reactions   Bee Venom Anaphylaxis   MIXED VESPID, HONEY BEE        Medication List    ascorbic acid 500 MG tablet Commonly known as: VITAMIN C Take 1,000 mg by mouth daily.   b complex vitamins tablet Take 1 tablet by mouth daily.   Centrum Silver Adult 50+ Tabs Take 1 tablet by mouth daily.   EPINEPHrine 0.3 mg/0.3 mL Soaj injection Commonly known as: EpiPen 2-Pak Use as directed for severe allergic reaction   EPINEPHrine 0.3 mg/0.3 mL Soaj injection Commonly known as: EpiPen 2-Pak Inject  0.3 mg into the muscle as needed for anaphylaxis.   ibuprofen 200 MG tablet Commonly known as: ADVIL Take 400 mg by mouth every 6 (six) hours as needed for mild pain. Hs prn   magnesium oxide 400 MG tablet Commonly known as: MAG-OX Take 400 mg by mouth daily.   milk thistle 175 MG tablet Take 175 mg by mouth daily.   NON FORMULARY Ca(Life Extension Bone Restore with Vit K2)  Take 3 capsules po once daily.   Turmeric Curcumin 500 MG Caps Take 500 mg by mouth daily.   Vitamin D 1000 units capsule Take 2,000 Units by mouth daily.   zinc gluconate 50 MG tablet Take 50 mg by mouth daily.     Past Medical History:  Diagnosis Date   Basal cell carcinoma 2008   Bulging of cervical intervertebral disc    Carpal tunnel syndrome on right    Finger numbness    Hymenoptera allergy    Osteoporosis    Postmenopausal    Raynaud's disease    self diagnosis    Past Surgical History:  Procedure Laterality Date   BIOPSY ENDOMETRIAL  2006   BREAST CYST ASPIRATION  12/02/1995    Review of systems negative except as noted in HPI / PMHx or noted below:  Review of Systems  Constitutional: Negative.   HENT: Negative.    Eyes: Negative.   Respiratory: Negative.    Cardiovascular: Negative.   Gastrointestinal: Negative.   Genitourinary:  Negative.   Musculoskeletal: Negative.   Skin: Negative.   Neurological: Negative.   Endo/Heme/Allergies: Negative.   Psychiatric/Behavioral: Negative.       Objective:   Vitals:   10/19/22 1123  BP: 114/78  Pulse: 65  Resp: 16  Temp: 98.7 F (37.1 C)  SpO2: 100%   Height: 5' 3.74" (161.9 cm)  Weight: 94 lb 4.8 oz (42.8 kg)   Physical Exam HENT:     Head: Normocephalic.     Right Ear: Tympanic membrane normal.     Left Ear: Tympanic membrane normal.     Nose: No mucosal edema or rhinorrhea.     Mouth/Throat:     Pharynx: No oropharyngeal exudate.  Skin:    Findings: No rash.     Diagnostics: none  Assessment and Plan:    1. Anaphylaxis due to hymenoptera venom, accidental or unintentional, subsequent encounter    1.  Continue immunotherapy directed against mixed vespid and wasp  2.  Continue EpiPen if needed  3.  Return to clinic in 1 year or earlier if problem  Tammy Shields will continue to use immunotherapy currently at every 12-week administration.  She has a few other issues that she is working through including recurrent chalazions and phantosmia that we are not going to address during today's visit and needs to be addressed by her ophthalmologist and primary care doctor if these issues increase in intensity or frequency.  If she does well I will see her back in this clinic in 1 year or earlier if there is a problem.   Allena Katz, MD Allergy / Immunology [place]

## 2022-10-20 ENCOUNTER — Encounter: Payer: Self-pay | Admitting: Allergy and Immunology

## 2022-10-26 ENCOUNTER — Ambulatory Visit (INDEPENDENT_AMBULATORY_CARE_PROVIDER_SITE_OTHER): Payer: PPO | Admitting: Internal Medicine

## 2022-10-26 ENCOUNTER — Telehealth: Payer: Self-pay | Admitting: Internal Medicine

## 2022-10-26 VITALS — BP 116/80 | HR 76 | Temp 99.4°F | Ht 63.0 in | Wt 92.0 lb

## 2022-10-26 DIAGNOSIS — L309 Dermatitis, unspecified: Secondary | ICD-10-CM | POA: Diagnosis not present

## 2022-10-26 DIAGNOSIS — R1013 Epigastric pain: Secondary | ICD-10-CM | POA: Diagnosis not present

## 2022-10-26 MED ORDER — CLINDAMYCIN PHOSPHATE 1 % EX LOTN: TOPICAL_LOTION | Freq: Two times a day (BID) | CUTANEOUS | 0 refills | Status: DC

## 2022-10-26 NOTE — Progress Notes (Signed)
Patient Care Team: Elby Showers, MD as PCP - General (Internal Medicine)  Visit Date: 10/26/22  Subjective:    Patient ID: Tammy Shields, DDS , Female   DOB: 06-Aug-1955, 67 y.o.    MRN: [id number]   66 y.o. Female presents today for face rash. First noted rash on 10/05/22. New lesions formed in the past two days. Using CeraVe lotion, hot compress,  topical cortisone cream. Has been using Tobramycin and dexamethasone eye drops. Also has rash on her back. Has been to the beach twice recently with some sun exposure.  Reports recent dyspepsia, epigastric discomfort. Has been cutting out foods that don't usually agree with her. Colonoscopy last completed 09/07/13. Pathology showed 2 hyperplastic polyps. Recommended repeat in 2025.  Past Medical History:  Diagnosis Date   Basal cell carcinoma 2008   Bulging of cervical intervertebral disc    Carpal tunnel syndrome on right    Finger numbness    Hymenoptera allergy    Osteoporosis    Postmenopausal    Raynaud's disease    self diagnosis     Family History  Problem Relation Age of Onset   Heart disease Mother    Diabetes Mother    Lung cancer Mother    Hypertension Mother    Prostate cancer Father    Hypertension Sister    Diabetes Brother     Social History   Social History Narrative   Not on file      Review of Systems  Constitutional:  Positive for fever (99.4 in office). Negative for malaise/fatigue.  HENT:  Negative for congestion.   Eyes:  Negative for blurred vision.  Respiratory:  Negative for cough and shortness of breath.   Cardiovascular:  Negative for chest pain, palpitations and leg swelling.  Gastrointestinal:  Positive for abdominal pain (Epigastric). Negative for vomiting.  Musculoskeletal:  Negative for back pain.  Skin:  Positive for rash.  Neurological:  Negative for loss of consciousness and headaches.        Objective:   Vitals: BP 116/80   Pulse 76   Temp 99.4 F (37.4 C) (Tympanic)    Ht 5\' 3"  (1.6 m)   Wt 92 lb (41.7 kg)   SpO2 98%   PF 116 L/min   BMI 16.30 kg/m    Physical Exam Vitals and nursing note reviewed.  Constitutional:      General: She is not in acute distress.    Appearance: Normal appearance. She is not toxic-appearing.  HENT:     Head: Normocephalic and atraumatic.  Pulmonary:     Effort: Pulmonary effort is normal.  Skin:    General: Skin is warm and dry.     Findings: Rash present. Rash is macular and papular.     Comments: Macular papular fine erythematous rash in patches on her cheeks. No hordeolums.  Neurological:     Mental Status: She is alert and oriented to person, place, and time. Mental status is at baseline.  Psychiatric:        Mood and Affect: Mood normal.        Behavior: Behavior normal.        Thought Content: Thought content normal.        Judgment: Judgment normal.       Results:   Studies obtained and personally reviewed by me:  Colonoscopy last completed 09/07/13. Pathology showed 2 hyperplastic polyps. Recommended repeat in 2025.  Labs:       Component Value Date/Time  NA 142 07/09/2022 0921   NA 141 03/01/2019 0917   K 4.5 07/09/2022 0921   CL 104 07/09/2022 0921   CO2 30 07/09/2022 0921   GLUCOSE 88 07/09/2022 0921   BUN 24 07/09/2022 0921   BUN 21 03/01/2019 0917   CREATININE 0.65 07/09/2022 0921   CALCIUM 9.3 07/09/2022 0921   PROT 6.3 07/09/2022 0921   PROT 6.1 03/01/2019 0917   ALBUMIN 4.4 03/01/2019 0917   AST 24 07/09/2022 0921   ALT 17 07/09/2022 0921   ALKPHOS 70 03/01/2019 0917   BILITOT 0.5 07/09/2022 0921   BILITOT 0.2 03/01/2019 0917   GFRNONAA 91 06/27/2020 0905   GFRAA 105 06/27/2020 0905     Lab Results  Component Value Date   WBC 3.4 (L) 07/09/2022   HGB 13.1 07/09/2022   HCT 38.5 07/09/2022   MCV 95.1 07/09/2022   PLT 269 07/09/2022    Lab Results  Component Value Date   CHOL 196 07/09/2022   HDL 82 07/09/2022   LDLCALC 99 07/09/2022   TRIG 60 07/09/2022    CHOLHDL 2.4 07/09/2022    Lab Results  Component Value Date   HGBA1C 5.4 06/27/2020     Lab Results  Component Value Date   TSH 1.00 07/09/2022      Assessment & Plan:   Facial dermatitis: Cleocin-T 1% apply twice daily.   Dyspepsia, epigastric discomfort: refer to Gastroenterologist if pain persists.Watch diet.  Return on 10/28/22 for CBC, iron/TIBC, Vitamin D, B12.    I,Alexander Ruley,acting as a scribe for Elby Showers, MD.,have documented all relevant documentation on the behalf of Elby Showers, MD,as directed by  Elby Showers, MD while in the presence of Elby Showers, MD.   I, Elby Showers, MD, have reviewed all documentation for this visit. The documentation on 10/27/22 for the exam, diagnosis, procedures, and orders are all accurate and complete.

## 2022-10-26 NOTE — Telephone Encounter (Signed)
Scheduled

## 2022-10-26 NOTE — Telephone Encounter (Signed)
Dr Vivia Ewing [phone]  Dr Ennis Forts called to says she has these places on her face that are spreading and they have puss in them for the last couple of weeks and she wants to come in for you to look at them. She was having some issues with with her eyes and oil glands and now this is happening

## 2022-10-27 ENCOUNTER — Encounter: Payer: Self-pay | Admitting: Internal Medicine

## 2022-10-27 NOTE — Patient Instructions (Addendum)
Trial of Cleocin-T to face twice daily. Return April 4 for labs. Check for Vitamin deficiency. Consider Gastroenterology consult if dyspepsia persists.

## 2022-10-28 ENCOUNTER — Other Ambulatory Visit: Payer: PPO

## 2022-10-28 DIAGNOSIS — R21 Rash and other nonspecific skin eruption: Secondary | ICD-10-CM

## 2022-10-28 DIAGNOSIS — R109 Unspecified abdominal pain: Secondary | ICD-10-CM

## 2022-10-29 ENCOUNTER — Other Ambulatory Visit: Payer: PPO

## 2022-10-29 DIAGNOSIS — M810 Age-related osteoporosis without current pathological fracture: Secondary | ICD-10-CM | POA: Diagnosis not present

## 2022-10-29 DIAGNOSIS — R109 Unspecified abdominal pain: Secondary | ICD-10-CM | POA: Diagnosis not present

## 2022-10-29 DIAGNOSIS — L309 Dermatitis, unspecified: Secondary | ICD-10-CM | POA: Diagnosis not present

## 2022-10-30 LAB — CBC WITH DIFFERENTIAL/PLATELET
Absolute Monocytes: 163 cells/uL — ABNORMAL LOW (ref 200–950)
Basophils Absolute: 20 cells/uL (ref 0–200)
Basophils Relative: 0.8 %
Eosinophils Absolute: 30 cells/uL (ref 15–500)
Eosinophils Relative: 1.2 %
HCT: 41.3 % (ref 35.0–45.0)
Hemoglobin: 13.9 g/dL (ref 11.7–15.5)
Lymphs Abs: 948 cells/uL (ref 850–3900)
MCH: 31.2 pg (ref 27.0–33.0)
MCHC: 33.7 g/dL (ref 32.0–36.0)
MCV: 92.8 fL (ref 80.0–100.0)
MPV: 11 fL (ref 7.5–12.5)
Monocytes Relative: 6.5 %
Neutro Abs: 1340 cells/uL — ABNORMAL LOW (ref 1500–7800)
Neutrophils Relative %: 53.6 %
Platelets: 183 10*3/uL (ref 140–400)
RBC: 4.45 10*6/uL (ref 3.80–5.10)
RDW: 12 % (ref 11.0–15.0)
Total Lymphocyte: 37.9 %
WBC: 2.5 10*3/uL — ABNORMAL LOW (ref 3.8–10.8)

## 2022-10-30 LAB — IRON, TOTAL/TOTAL IRON BINDING CAP
%SAT: 33 % (calc) (ref 16–45)
Iron: 115 ug/dL (ref 45–160)
TIBC: 345 mcg/dL (calc) (ref 250–450)

## 2022-10-30 LAB — VITAMIN B12: Vitamin B-12: 1893 pg/mL — ABNORMAL HIGH (ref 200–1100)

## 2022-10-30 LAB — VITAMIN D 25 HYDROXY (VIT D DEFICIENCY, FRACTURES): Vit D, 25-Hydroxy: 36 ng/mL (ref 30–100)

## 2022-11-03 DIAGNOSIS — M542 Cervicalgia: Secondary | ICD-10-CM | POA: Diagnosis not present

## 2022-11-04 ENCOUNTER — Encounter: Payer: Self-pay | Admitting: Gastroenterology

## 2022-11-11 DIAGNOSIS — M542 Cervicalgia: Secondary | ICD-10-CM | POA: Diagnosis not present

## 2022-11-29 DIAGNOSIS — M542 Cervicalgia: Secondary | ICD-10-CM | POA: Diagnosis not present

## 2022-12-03 DIAGNOSIS — H0014 Chalazion left upper eyelid: Secondary | ICD-10-CM | POA: Diagnosis not present

## 2022-12-14 DIAGNOSIS — H01004 Unspecified blepharitis left upper eyelid: Secondary | ICD-10-CM | POA: Diagnosis not present

## 2022-12-14 DIAGNOSIS — H2513 Age-related nuclear cataract, bilateral: Secondary | ICD-10-CM | POA: Diagnosis not present

## 2022-12-14 DIAGNOSIS — H0014 Chalazion left upper eyelid: Secondary | ICD-10-CM | POA: Diagnosis not present

## 2022-12-14 DIAGNOSIS — H524 Presbyopia: Secondary | ICD-10-CM | POA: Diagnosis not present

## 2022-12-14 DIAGNOSIS — H01001 Unspecified blepharitis right upper eyelid: Secondary | ICD-10-CM | POA: Diagnosis not present

## 2022-12-22 DIAGNOSIS — M542 Cervicalgia: Secondary | ICD-10-CM | POA: Diagnosis not present

## 2023-01-04 DIAGNOSIS — M542 Cervicalgia: Secondary | ICD-10-CM | POA: Diagnosis not present

## 2023-01-10 ENCOUNTER — Ambulatory Visit (INDEPENDENT_AMBULATORY_CARE_PROVIDER_SITE_OTHER): Payer: PPO

## 2023-01-10 DIAGNOSIS — T63441D Toxic effect of venom of bees, accidental (unintentional), subsequent encounter: Secondary | ICD-10-CM | POA: Diagnosis not present

## 2023-01-17 DIAGNOSIS — M542 Cervicalgia: Secondary | ICD-10-CM | POA: Diagnosis not present

## 2023-01-19 ENCOUNTER — Ambulatory Visit: Payer: PPO | Admitting: Sports Medicine

## 2023-01-19 VITALS — BP 120/70 | Ht 64.0 in | Wt 95.0 lb

## 2023-01-19 DIAGNOSIS — R269 Unspecified abnormalities of gait and mobility: Secondary | ICD-10-CM | POA: Diagnosis not present

## 2023-01-19 NOTE — Progress Notes (Unsigned)
  Tammy Shields, DDS - 67 y.o. female MRN [id number]  Date of birth: Nov 23, 1955    CHIEF COMPLAINT:   bilat feet pain    SUBJECTIVE:   HPI:  Pleasant 67 year old female comes to clinic for bilateral feet pain.  She has had foot pain for years.  She has been wearing custom orthotics from this clinic for years.  She is worn through her previous pair of orthotics are made in 2021.  She would like a new pair of orthotics made today.  ROS:     See HPI  PERTINENT  PMH / PSH FH / / SH:  Past Medical, Surgical, Social, and Family History Reviewed & Updated in the EMR.  Pertinent findings include:  none  OBJECTIVE: BP 120/70   Ht 5\' 4"  (1.626 m)   Wt 95 lb (43.1 kg)   BMI 16.31 kg/m   Physical Exam:  Vital signs are reviewed.  GEN: Alert and oriented, NAD Pulm: Breathing unlabored PSY: normal mood, congruent affect  MSK: Feet -mild collapse of longitudinal arch height bilaterally.  Moderate collapse of transverse arch bilaterally with early toe splaying.  Neutral alignment through the hindfoot, midfoot, forefoot.  Negative to many toe signs bilaterally.  Symmetric heel inversion with toe raise bilaterally.  Gait -walks with supination   ASSESSMENT & PLAN:  1.  Abnormality of gait  -Patient was fitted for custom orthotics today.  Can follow-up as needed for adjustments or duplicates.  Patient was fitted for a : standard, cushioned, semi-rigid orthotic. The orthotic was heated and afterward the patient stood on the orthotic blank positioned on the orthotic stand. The patient was positioned in subtalar neutral position and 10 degrees of ankle dorsiflexion in a weight bearing stance. After completion of molding, a stable base was applied to the orthotic blank. The blank was ground to a stable position for weight bearing. Size: 6 Base: blue EVA Posting: none Additional orthotic padding: none  Gait was neutral with orthotics in place. Patient found them to be comfortable. Follow-up  as needed.   Arvella Nigh, MD PGY-4, Sports Medicine Fellow [facility]  Addendum:  I was the preceptor for this visit and available for immediate consultation.  Norton Blizzard MD Marrianne Mood

## 2023-01-24 DIAGNOSIS — L718 Other rosacea: Secondary | ICD-10-CM | POA: Diagnosis not present

## 2023-01-31 DIAGNOSIS — M542 Cervicalgia: Secondary | ICD-10-CM | POA: Diagnosis not present

## 2023-02-14 DIAGNOSIS — M542 Cervicalgia: Secondary | ICD-10-CM | POA: Diagnosis not present

## 2023-02-16 ENCOUNTER — Ambulatory Visit: Payer: PPO | Admitting: Gastroenterology

## 2023-02-16 ENCOUNTER — Encounter: Payer: Self-pay | Admitting: Gastroenterology

## 2023-02-16 ENCOUNTER — Telehealth: Payer: Self-pay

## 2023-02-16 ENCOUNTER — Other Ambulatory Visit: Payer: PPO

## 2023-02-16 VITALS — BP 110/70 | HR 80 | Ht 64.0 in | Wt 95.0 lb

## 2023-02-16 DIAGNOSIS — R140 Abdominal distension (gaseous): Secondary | ICD-10-CM

## 2023-02-16 DIAGNOSIS — R1013 Epigastric pain: Secondary | ICD-10-CM | POA: Diagnosis not present

## 2023-02-16 DIAGNOSIS — E639 Nutritional deficiency, unspecified: Secondary | ICD-10-CM

## 2023-02-16 NOTE — Telephone Encounter (Signed)
-----   Message from Thatcher T. Russella Dar sent at 02/16/2023 12:22 PM EDT ----- Please have patient come to the lab for tTG, IgA Noted this had not been previously checked after she left

## 2023-02-16 NOTE — Addendum Note (Signed)
Addended by: Illene Bolus on: 02/16/2023 01:36 PM   Modules accepted: Orders

## 2023-02-16 NOTE — Patient Instructions (Signed)
Please take your pepcid 20 mg daily x 1 month.   You have been scheduled for an abdominal ultrasound at [facility] (1st floor of hospital) on 02/22/23 at 9:30am. Please arrive 30 minutes prior to your appointment for registration. Make certain not to have anything to eat or drink 6 hours prior to your appointment. Should you need to reschedule your appointment, please contact radiology at [phone]. This test typically takes about 30 minutes to perform.  If your symptoms have not improved, please contact our office to schedule a Upper Endoscopy.   _______________________________________________________  If your blood pressure at your visit was 140/90 or greater, please contact your primary care physician to follow up on this.  _______________________________________________________  If you are age 30 or older, your body mass index should be between 23-30. Your Body mass index is 16.31 kg/m. If this is out of the aforementioned range listed, please consider follow up with your Primary Care Provider.  If you are age 66 or younger, your body mass index should be between 19-25. Your Body mass index is 16.31 kg/m. If this is out of the aformentioned range listed, please consider follow up with your Primary Care Provider.   ________________________________________________________  The [facility] providers would like to encourage you to use [facility] to communicate with providers for non-urgent requests or questions.  Due to long hold times on the telephone, sending your provider a message by [facility] may be a faster and more efficient way to get a response.  Please allow 48 business hours for a response.  Please remember that this is for non-urgent requests.  _______________________________________________________  Thank you for choosing me and [facility].  Venita Lick. Pleas Koch., MD., Clementeen Graham

## 2023-02-16 NOTE — Telephone Encounter (Signed)
Left message for patient to return my call.

## 2023-02-16 NOTE — Telephone Encounter (Signed)
Spoke with patient and informed her to come by our lab in the basement for lab work. Patient verbalized understanding and states she will come today.

## 2023-02-16 NOTE — Progress Notes (Signed)
Assessment    Chronic postprandial epigastric pain and bloating.  Intermittent mid back pain.  R/O GERD, gastritis, ulcer, celiac disease, cholelithiasis, mesenteric ischemia, occult malignancy, IBS Mild constipation currently well-controlled CRC screening, average risk BMI=16.31, undernutrition    Recommendations   Recommended proceeding with an abdominal/pelvic CTA and EGD.  She prefers less invasive testing to start with and is agreeable to an abdominal US and a trial of famotidine 20 mg daily (not prn) for 1 month.  If symptoms are not controlled and her abd [place] is unrevealing she would be willing to proceed with abdominal/pelvic CTA and EGD. 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  She relates a several year history of frequent postprandial epigastric pain with occasional reflux symptoms generally following meals.  She also notes frequent bloating following meals.  Occasionally she notes mid back pain when bending over regularly and it also occurs occasionally with her epigastric pain following meals.  She notes occasional mild hoarseness. She has a history of diarrhea and constipation in the past. Most recently she has had mild constipation which is well-controlled with a magnesium supplement.   She takes famotidine 20 mg daily as needed however she takes this infrequently.  She has made multiple adjustments in her diet over the years to avoid foods and beverages that are bothersome. She has noted intermittent small-volume rectal bleeding with bowel movements.  She relates her GI symptoms worsened after having COVID-19 in November 2023.  Denies weight loss, diarrhea, change in stool caliber, melena, nausea, vomiting, dysphagia, chest pain.   Previous Labs  / Imaging::    Latest Ref Rng & Units 10/29/2022   10:56 AM 07/09/2022    9:21 AM 07/07/2021   11:21 AM  CBC  WBC 3.8 - 10.8 Thousand/uL 2.5  3.4  2.4   Hemoglobin 11.7 - 15.5 g/dL 11.9  14.7  82.9   Hematocrit 35.0 - 45.0 % 41.3  38.5  39.1   Platelets 140 - 400 Thousand/uL 183  269  162     No results found for: "LIPASE"    Latest Ref Rng & Units 07/09/2022    9:21 AM 07/07/2021   11:21 AM 06/27/2020    9:05 AM  CMP  Glucose 65 - 99 mg/dL 88  96  93   BUN 7 - 25 mg/dL 24  30  20    Creatinine 0.50 - 1.05 mg/dL 5.62  1.30  8.65   Sodium 135 - 146 mmol/L 142  144  141   Potassium 3.5 - 5.3 mmol/L 4.5  5.4  4.7   Chloride 98 - 110 mmol/L 104  106  104   CO2 20 - 32 mmol/L 30  30  32   Calcium 8.6 - 10.4 mg/dL 9.3  9.8  9.5   Total Protein 6.1 - 8.1 g/dL 6.3  6.7  6.6   Total Bilirubin 0.2 - 1.2 mg/dL 0.5  0.3  0.3   AST 10 - 35 U/L 24  26  26    ALT 6 - 29 U/L 17  21  17       Previous GI evaluation    Endoscopies:  Colonoscopy Feb 2015 - 2 small polyps - HP and normal mucosa - Moderate internal hemorrhoids    Imaging:  Past Medical History:  Diagnosis Date   Basal cell carcinoma 2008   Bulging of cervical intervertebral disc    Carpal tunnel syndrome on right    Finger numbness    Hymenoptera allergy    Osteoporosis    Postmenopausal    Raynaud's disease    self diagnosis   Past Surgical History:  Procedure Laterality Date   BIOPSY ENDOMETRIAL  2006   BREAST CYST ASPIRATION  12/02/1995   Family History  Problem Relation Age of Onset   Heart disease Mother    Diabetes Mother    Lung cancer Mother    Hypertension Mother    Prostate cancer Father    Hypertension Sister    Diabetes Brother    Colon cancer Neg Hx    Stomach cancer Neg Hx    Esophageal cancer Neg Hx    Social History   Tobacco Use   Smoking status: Never   Smokeless tobacco: Never  Vaping Use   Vaping status: Never Used  Substance Use Topics   Alcohol use: Yes     Alcohol/week: 8.0 - 10.0 standard drinks of alcohol    Types: 8 - 10 Glasses of wine per week   Drug use: No   Current Outpatient Medications  Medication Sig Dispense Refill   EPINEPHrine (EPIPEN 2-PAK) 0.3 mg/0.3 mL IJ SOAJ injection Use as directed for severe allergic reaction 2 each 2   Cholecalciferol (VITAMIN D) 1000 UNITS capsule Take 2,000 Units by mouth daily.     clindamycin (CLEOCIN-T) 1 % lotion Apply topically 2 (two) times daily. 60 mL 0   EPINEPHrine (EPIPEN 2-PAK) 0.3 mg/0.3 mL IJ SOAJ injection Inject 0.3 mg into the muscle as needed for anaphylaxis. 1 each 1   ibuprofen (ADVIL,MOTRIN) 200 MG tablet Take 400 mg by mouth every 6 (six) hours as needed for mild pain. Hs prn (Patient not taking: Reported on 10/26/2022)     magnesium oxide (MAG-OX) 400 MG tablet Take 800 mg by mouth daily.     milk thistle 175 MG tablet Take 175 mg by mouth daily.     Multiple Vitamins-Minerals (CENTRUM SILVER ADULT 50+) TABS Take 1 tablet by mouth daily.     NON FORMULARY Ca(Life Extension Bone Restore with Vit K2)  Take 3 capsules po once daily.     Turmeric Curcumin 500 MG CAPS Take 500 mg by mouth daily.     vitamin B-12 (CYANOCOBALAMIN) 100 MCG tablet Take 100 mcg by mouth daily.     vitamin C (ASCORBIC ACID) 500 MG tablet Take 1,000 mg by mouth daily.      zinc gluconate 50 MG tablet Take 50 mg by mouth daily.     No current facility-administered medications for this visit.   Allergies  Allergen Reactions   Bee Venom Anaphylaxis    MIXED VESPID, HONEY BEE    Review of Systems: All other systems reviewed and negative except where noted in HPI.    Physical Exam    Wt Readings from Last 3 Encounters:  01/19/23 95 lb (43.1 kg)  10/26/22 92 lb (41.7 kg)  10/19/22 94 lb 4.8 oz (42.8 kg)    Ht 5\' 4"  (1.626 m)   BMI 16.31 kg/m  Constitutional:  Generally well appearing thin female in no acute distress. HEENT: Pupils normal.  Conjunctivae are normal. No scleral icterus. No oral  lesions or deformities noted.  Neck: Supple.  Cardiac: Normal rate, regular rhythm without murmurs. Pulmonary/chest: Effort normal and breath sounds normal. No  wheezing, rales or rhonchi. Abdominal: Soft, nondistended, nontender. Active bowel sounds. No palpable HSM, masses or hernias. Rectal: Not done Extremities: No edema or deformities noted Neurological: Alert and oriented to person, place and time. Psychiatric: Pleasant. Normal mood and affect. Behavior is normal. Skin: Skin is warm and dry. No rashes noted.  Claudette Head, MD   cc:  Referring Provider Baxley, Luanna Cole, MD

## 2023-02-17 LAB — TISSUE TRANSGLUTAMINASE, IGA: (tTG) Ab, IgA: <1 U/mL

## 2023-02-17 LAB — IGA: Immunoglobulin A: 66 mg/dL — ABNORMAL LOW (ref 70–320)

## 2023-02-18 ENCOUNTER — Other Ambulatory Visit: Payer: Self-pay

## 2023-02-18 DIAGNOSIS — E639 Nutritional deficiency, unspecified: Secondary | ICD-10-CM

## 2023-02-18 DIAGNOSIS — R1013 Epigastric pain: Secondary | ICD-10-CM

## 2023-02-18 DIAGNOSIS — R140 Abdominal distension (gaseous): Secondary | ICD-10-CM

## 2023-02-21 ENCOUNTER — Other Ambulatory Visit: Payer: PPO

## 2023-02-21 DIAGNOSIS — R1013 Epigastric pain: Secondary | ICD-10-CM | POA: Diagnosis not present

## 2023-02-21 DIAGNOSIS — R140 Abdominal distension (gaseous): Secondary | ICD-10-CM | POA: Diagnosis not present

## 2023-02-21 DIAGNOSIS — E639 Nutritional deficiency, unspecified: Secondary | ICD-10-CM | POA: Diagnosis not present

## 2023-02-22 ENCOUNTER — Ambulatory Visit (HOSPITAL_COMMUNITY)
Admission: RE | Admit: 2023-02-22 | Discharge: 2023-02-22 | Disposition: A | Discharge status: Home / Self Care | Payer: PPO | Source: Ambulatory Visit | Attending: Gastroenterology | Admitting: Gastroenterology

## 2023-02-22 DIAGNOSIS — R1013 Epigastric pain: Secondary | ICD-10-CM | POA: Insufficient documentation

## 2023-02-22 DIAGNOSIS — R140 Abdominal distension (gaseous): Secondary | ICD-10-CM | POA: Diagnosis not present

## 2023-02-28 DIAGNOSIS — M542 Cervicalgia: Secondary | ICD-10-CM | POA: Diagnosis not present

## 2023-03-01 ENCOUNTER — Telehealth: Payer: Self-pay

## 2023-03-01 NOTE — Telephone Encounter (Signed)
The pt has been advised of the lab results.

## 2023-03-01 NOTE — Telephone Encounter (Signed)
-----   Message from [place] T. Russella Dar sent at 03/01/2023  3:43 PM EDT ----- Regarding: FW: DQ2, DQ8 are both negative - celiac disease is excluded (99% certainty) ----- Message ----- From: Interface, [facility] Lab Results In Sent: 03/01/2023   9:37 AM EDT To: Meryl Dare, MD

## 2023-03-21 DIAGNOSIS — L718 Other rosacea: Secondary | ICD-10-CM | POA: Diagnosis not present

## 2023-03-23 DIAGNOSIS — M542 Cervicalgia: Secondary | ICD-10-CM | POA: Diagnosis not present

## 2023-03-30 ENCOUNTER — Telehealth: Payer: Self-pay | Admitting: Gastroenterology

## 2023-03-30 NOTE — Telephone Encounter (Signed)
Inbound call from patient stating she received a notice of non payment of 7/29 labs due to no referral for her insurance. Patient requesting a call back to discuss further. Please advise, thank you.

## 2023-03-30 NOTE — Telephone Encounter (Signed)
Tammy Shields, I am not sure how to handle this question. Can you help?  It seems to be a lab billing issue- the pt is stating she was told that a prior auth was needed for the lab but we do not do PA's for labs correct?   I did ask the pt to call the billing number on her bill as well

## 2023-04-04 ENCOUNTER — Ambulatory Visit (INDEPENDENT_AMBULATORY_CARE_PROVIDER_SITE_OTHER): Payer: PPO

## 2023-04-04 DIAGNOSIS — T63441D Toxic effect of venom of bees, accidental (unintentional), subsequent encounter: Secondary | ICD-10-CM

## 2023-04-04 DIAGNOSIS — M542 Cervicalgia: Secondary | ICD-10-CM | POA: Diagnosis not present

## 2023-04-04 NOTE — Telephone Encounter (Signed)
Patient called to follow up on billing issues but also stated she has not received a bill as of yet. Said she was told someone would be calling her back but she has not heard anything yet. Please advise.

## 2023-04-04 NOTE — Telephone Encounter (Signed)
The pt has been advised that she should wait until she gets the bill then call the billing number on the bill itself.  She agrees and will let us know if there are any other questions.

## 2023-04-18 DIAGNOSIS — M542 Cervicalgia: Secondary | ICD-10-CM | POA: Diagnosis not present

## 2023-04-26 DIAGNOSIS — M542 Cervicalgia: Secondary | ICD-10-CM | POA: Diagnosis not present

## 2023-05-04 DIAGNOSIS — M542 Cervicalgia: Secondary | ICD-10-CM | POA: Diagnosis not present

## 2023-05-13 ENCOUNTER — Encounter: Payer: Self-pay | Admitting: Internal Medicine

## 2023-05-13 ENCOUNTER — Ambulatory Visit: Payer: PPO | Admitting: Internal Medicine

## 2023-05-13 VITALS — BP 118/76 | HR 76 | Ht 64.0 in | Wt 98.6 lb

## 2023-05-13 DIAGNOSIS — M810 Age-related osteoporosis without current pathological fracture: Secondary | ICD-10-CM | POA: Diagnosis not present

## 2023-05-13 LAB — MAGNESIUM: Magnesium: 2 mg/dL (ref 1.5–2.5)

## 2023-05-13 LAB — BASIC METABOLIC PANEL
BUN: 28 mg/dL — ABNORMAL HIGH (ref 6–23)
CO2: 30 meq/L (ref 19–32)
Calcium: 9.5 mg/dL (ref 8.4–10.5)
Chloride: 104 meq/L (ref 96–112)
Creatinine, Ser: 0.59 mg/dL (ref 0.40–1.20)
GFR: 91.94 mL/min (ref >60.00)
Glucose, Bld: 96 mg/dL (ref 70–99)
Potassium: 4.2 meq/L (ref 3.5–5.1)
Sodium: 142 meq/L (ref 135–145)

## 2023-05-13 LAB — T4, FREE: Free T4: 0.61 ng/dL (ref 0.60–1.60)

## 2023-05-13 LAB — VITAMIN D 25 HYDROXY (VIT D DEFICIENCY, FRACTURES): VITD: 36.92 ng/mL (ref 30.00–100.00)

## 2023-05-13 LAB — ALBUMIN: Albumin: 4.5 g/dL (ref 3.5–5.2)

## 2023-05-13 LAB — PHOSPHORUS: Phosphorus: 3.8 mg/dL (ref 2.3–4.6)

## 2023-05-13 LAB — TSH: TSH: 0.58 u[IU]/mL (ref 0.35–5.50)

## 2023-05-13 NOTE — Patient Instructions (Signed)

## 2023-05-13 NOTE — Progress Notes (Unsigned)
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 female with a past medical history of osteoporosis, Raynaud's disease. The patient presented for initial endocrinology clinic visit on 05/13/2023 for consultative assistance with her osteoporosis.   Pt was diagnosed with osteoporosis: In 2012 with a T-score of -3.2 at the AP spine  Menarche at age : 39 Menopausal at age : late 46;s  Fracture Hx: no Hx of HRT: no  FH of osteoporosis or hip fracture: sister with Osteopenia   Prior Hx of anti-resorptive therapy :  was on Fosamax for 2  years, which bothered her stomach , which was > 20 yrs ago.    MVI  Bone restore with Vitamin K , 4 caps = 1 serving = Calcium Carbonate 700 mg , D3 1000 units  She is a retired Education officer, community  She does body pump exercise as a form of weight bearing exercise.   No falls She is under the care of GI for epigastric pain  Denies constipation or diarrhea  , in the past was having diarrhea while on magnesium tablets  Has occasional arthralgias of the left hip Has OA of the  right hand  Has occasional right hand  numbness due to carpal tunnel  No glucocorticoids  No celiac disease dx  Pt not keen on medications   No prior CVA or CAD  She has been eating more protein in the past 2 years to build muscles No salty foods generally     HISTORY:  Past Medical History:  Past Medical History:  Diagnosis Date   Basal cell carcinoma 2008   Bulging of cervical intervertebral disc    Carpal tunnel syndrome on right    Finger numbness    Hymenoptera allergy    Osteoporosis    Postmenopausal    Raynaud's disease    self diagnosis   Past Surgical History:  Past Surgical History:  Procedure Laterality Date   BIOPSY ENDOMETRIAL  2006    BREAST CYST ASPIRATION  12/02/1995    Social History:  reports that she has never smoked. She has never used smokeless tobacco. She reports current alcohol use of about 8.0 - 10.0 standard drinks of alcohol per week. She reports that she does not use drugs. Family History: family history includes Diabetes in her brother and mother; Heart disease in her mother; Hypertension in her mother and sister; Lung cancer in her mother; Prostate cancer in her father.   HOME MEDICATIONS: Allergies as of 05/13/2023       Reactions   Bee Venom Anaphylaxis   MIXED VESPID, HONEY BEE        Medication List        Accurate as of May 13, 2023 10:23 AM. If you have any questions, ask your nurse or doctor.          STOP taking these medications    EPINEPHrine 0.3 mg/0.3 mL Soaj injection Commonly known as: EpiPen 2-Pak Stopped by: Johnney Ou Amera Banos       TAKE these medications    ascorbic acid 500 MG tablet Commonly known as: VITAMIN C Take 1,000 mg by mouth daily.   Calcium Carb-Cholecalciferol 500-10 MG-MCG Chew Chew by mouth.   Centrum  Silver Adult 50+ Tabs Take 1 tablet by mouth daily.   co-enzyme Q-10 30 MG capsule Take by mouth.   famotidine 20 MG tablet Commonly known as: PEPCID Take 20 mg by mouth as needed for heartburn or indigestion.   Fish Oil 1000 MG Caps Take by mouth.   ibuprofen 200 MG tablet Commonly known as: ADVIL Take 400 mg by mouth every 6 (six) hours as needed for mild pain (pain score 1-3). Hs prn   IRON PO Take by mouth.   magnesium oxide 400 MG tablet Commonly known as: MAG-OX Take 800 mg by mouth daily.   metroNIDAZOLE 0.75 % gel Commonly known as: METROGEL Apply 1 Application topically 2 (two) times daily.   milk thistle 175 MG tablet Take 175 mg by mouth daily.   NON FORMULARY Ca(Life Extension Bone Restore with Vit K2)  Take 3 capsules po once daily.   Turmeric Curcumin 500 MG Caps Take 500 mg by mouth daily.   vitamin  B-12 100 MCG tablet Commonly known as: CYANOCOBALAMIN Take 100 mcg by mouth daily.   Vitamin D 1000 units capsule Take 2,000 Units by mouth daily.   zinc gluconate 50 MG tablet Take 50 mg by mouth daily.          REVIEW OF SYSTEMS: A comprehensive ROS was conducted with the patient and is negative except as per HPI and below:  ROS     OBJECTIVE:  VS: BP 118/76 (BP Location: Left Arm, Patient Position: Sitting, Cuff Size: Small)   Pulse 76   Ht 5\' 4"  (1.626 m)   Wt 98 lb 9.6 oz (44.7 kg)   SpO2 99%   BMI 16.92 kg/m    Wt Readings from Last 3 Encounters:  05/13/23 98 lb 9.6 oz (44.7 kg)  02/16/23 95 lb (43.1 kg)  01/19/23 95 lb (43.1 kg)     EXAM: General: Pt appears well and is in NAD  Eyes: External eye exam normal without stare, lid lag or exophthalmos.  EOM intact.  PERRL.  Neck: General: Supple without adenopathy. Thyroid: Thyroid size normal.  No goiter or nodules appreciated. No thyroid bruit.  Lungs: Clear with good BS bilat   Heart: Auscultation: RRR.  Abdomen: Soft, nontender  Extremities:  BL LE: No pretibial edema   Mental Status: Judgment, insight: Intact Orientation: Oriented to time, place, and person Mood and affect: No depression, anxiety, or agitation     DATA REVIEWED: ***    DXA 11/24/2021 ASSESSMENT: The BMD measured at AP Spine L1-L4 is 0.735 g/cm2 with a T-score of -3.7. This patient is considered osteoporotic according to World Health Organization [facility]) criteria.   The quality of the exam is good.   Site Region Measured Date Measured Age YA BMD Significant CHANGE T-score AP Spine  L1-L4      11/24/2021    67.9         -3.7    0.735 g/cm2 AP Spine  L1-L4      09/25/2019    65.8         -3.6    0.753 g/cm2   DualFemur Neck Left  11/24/2021    67.9         -2.7    0.657 g/cm2 DualFemur Neck Left  09/25/2019    65.8         -2.8    0.652 g/cm2   DualFemur Total Mean 11/24/2021    67.9         -2.4  0.710 g/cm2 DualFemur Total  Mean 09/25/2019    65.8         -2.4    0.706 g/cm2      ASSESSMENT/PLAN/RECOMMENDATIONS:   Osteoporosis :  -Patient with severe osteoporosis -She is at high risk for bone fractures, which she understands -She was on alendronate for a couple years many decades ago, she did develop GI side effects. -Patient is not keen on antiresorptive therapy, she is a retired Education officer, community and is concerned about side effects specifically osteonecrosis -I have emphasized the importance of optimizing calcium and vitamin D intake -She already does weightbearing exercises -Today we discussed options to include Evenity, Prolia, and bisphosphonate therapy -We briefly discussed the risks and the benefits of each class -Will proceed with 24-hour urinary calcium excretion as well as PTH and BMP    I will be happy to follow-up with her if she decides to go on antiresorptive therapy     Signed electronically by: Lyndle Herrlich, MD  [facility] Endocrinology  [facility] [address] [place] [place], [place] [address] Phone: [phone] FAX: [phone]   CC: Margaree Mackintosh, MD [address] [place] [address] Phone: [phone] Fax: [phone]   Return to Endocrinology clinic as below: Future Appointments  Date Time Provider Department Center  05/13/2023 10:30 AM [facility]yd[facility] Milley, Konrad Dolores, MD [facility] None  06/27/2023 11:00 AM [facility] NURSE [facility] None

## 2023-05-14 LAB — PARATHYROID HORMONE, INTACT (NO CA): PTH: 49 pg/mL (ref 16–77)

## 2023-05-16 ENCOUNTER — Other Ambulatory Visit: Payer: PPO

## 2023-05-16 DIAGNOSIS — M810 Age-related osteoporosis without current pathological fracture: Secondary | ICD-10-CM | POA: Diagnosis not present

## 2023-05-17 LAB — CALCIUM, URINE, 24 HOUR: Calcium, 24H Urine: 306 mg/(24.h) — ABNORMAL HIGH

## 2023-05-17 LAB — CREATININE, URINE, 24 HOUR: Creatinine, 24H Ur: 0.91 g/(24.h) (ref 0.50–2.15)

## 2023-05-18 ENCOUNTER — Telehealth: Payer: Self-pay | Admitting: Internal Medicine

## 2023-05-18 NOTE — Telephone Encounter (Signed)
Attempted to call the patient on 05/18/2023 at 12:20 PM to discuss hypercalciuria.   A voicemail was left for the patient to check the portal   I have recommended lifestyle changes with reducing amount of salty foods, reducing protein especially meat, and limiting calcium intake to no more than 1200 mg daily (supplements, dietary)    Will follow-up in 6 months

## 2023-05-23 ENCOUNTER — Telehealth: Payer: Self-pay | Admitting: Internal Medicine

## 2023-05-23 DIAGNOSIS — M542 Cervicalgia: Secondary | ICD-10-CM | POA: Diagnosis not present

## 2023-05-23 NOTE — Telephone Encounter (Signed)
Patient is calling to schedule her 6 month follow.  Patient is also asking how much Sodium and how much protein she needs to take daily.  Also, she wants to know when she adjusts each of these to the recommended levels, does she need a sooner follow up appointment or is 6 months still okay?

## 2023-05-24 NOTE — Telephone Encounter (Signed)
Left a detailed message,   J.Derelle Cockrell,RMA  

## 2023-05-30 ENCOUNTER — Telehealth: Payer: Self-pay

## 2023-05-30 NOTE — Telephone Encounter (Signed)
Patient has the following questions  Does she need a Dexa? Insurance will pay for nutritionist if she has renal failure, do you think she would qualify?  Would she need to have Dexa done before Prolia or do Prolia first?  Do you think the treatment plan with Remus Loffler is appropriate for her care. [facility] uses [place] for the Dexa and how do you feel about that.

## 2023-05-31 NOTE — Telephone Encounter (Signed)
LDTVM with information and advise to schedule appointment if patient has any additional questions

## 2023-06-09 ENCOUNTER — Ambulatory Visit (INDEPENDENT_AMBULATORY_CARE_PROVIDER_SITE_OTHER): Payer: Self-pay | Admitting: Family Medicine

## 2023-06-09 DIAGNOSIS — M810 Age-related osteoporosis without current pathological fracture: Secondary | ICD-10-CM

## 2023-06-09 NOTE — Progress Notes (Signed)
Medical Nutrition Therapy PCP Candis Shine, MD Endocrinologist Ibtehal "Abby" Georgette Dover, MD Gastroenterologist Judie Petit, MD  Appt start time: 1330 end time: 1400 (30 minutes)    Relevant history/background: Tammy Shields is interested in nutrition guidance related to osteoporosis.  Her most recent DXA scan (May 2023) showed a spine T-score of -3.7; L femur neck -2.7.  Complicating treatment decisions re. osteoporosis has been elevated 24-hr urinary calcium.  On 05/16/23, urinary Ca was 306 mg/24 hr (normal range=35-250).  Following this result, Dr. Lonzo Cloud recommended "reducing" intake of both salty foods and protein, especially meat, by at least a third or half, and limiting total (dietary/supplemental) Ca intake to <=1200 mg/day," with F/U scheduled for 6 months.   Tammy Shields grew up in a family where "everyone was on a diet."  In 2006, she had a slap tear in her L shoulder, which limited her exercise to using the elliptical for some time.  Over time, exercise increased to significant time investment and average steps of 20-30,000/day (see below), but she has recently responded to fatigue by reducing walking some and eliminating 2 X 60-min exercise classes.    Labs of note on 05/13/23: Vit D was 37 ng/mL; PTH was 49 pg/mL (normal=16-77); BUN was 28 mg/dL (ZOXWRU=0-45).    Assessment:  Tammy Shields lives with her husband, and shares dinner meals, which she usually prepares.  She has a h/o GI problems, which sometimes result in abdomen pain that means very low intake for days at a time.  Prior to her urinary Ca measurement, she had been taking 650 mg supplemental Ca at bedtime.  She now doses her Ca supplement as 175 mg 4 X day.  (Each capsule Life Extension Bone Restore=25 IU vit D, 50 mcg vit K2, 175 mg Ca, 75 mg Mg, 0.5 mg Zn, 0.25 mg Mn, 0.125 Si, 0.75 mg B).    I would like to see Tammy Shields obtain a repeat 24-hr urinary Ca sooner than 6 months b/c I am concerned that this restrictive diet may contribute  to greater muscle (and bone) loss.  In particular, her current diet is inadequate for both energy and protein.    Learning Readiness: Change in progress; Tammy Shields has followed recommendations to restrict Na, Ca, and protein intakes.  She has been averaging daily intake of 1000 to 1200 mg Na and 40-50 g protein.  Supplemental Ca is 700 mg/day in 4 X 175-mg doses.    Usual eating pattern: ~2 meals and 2 snacks per day. Weight: 91 lb (self-rept); wt on 05/13/23 was 98.4 lb; ht is 64".  Lost weight from ~110 lb ~20 yrs ago when she followed a modified keto diet.   Frequent foods and beverages: water, 2 c coffee w/ 1 tbsp 2% milk; homemade oatmeal raisin bites; 2-3 tbsp plain fat-free Grk yogurt, low-salt Triscuits, mixed nuts, peanuts, fish, chx, blueberry compote. Avoided foods: mayonnaise, most bread, rice, pasta, potatoes, black beans, high-sodium, fast foods.    Usual physical activity:  Averages 1 3/4 hrs exercise/day: ~1.5-mi walk daily; 60-min walk 5 X wk; 60-min body pump 2-3 X wk; 60-min [place] dance class 2 X wk; 60-min strength workout with personal trainer 2 X wk.  Averages 18,000 steps/day (previously 20-30,000/day).   Sleep: Estimates ~6 hours/night.  Usual bedtime 10 PM; usually up at 5 or 6 AM.   Food security -  Within the past 12 months, did you run out or worry that you would run out of food, and not have money to get more?  No.    24-hr recall suggests an intake of ~1000 (difficult to estimate kcal content of homemade bars): (Up at 6 AM; 8 oz water) B (6:10 AM)- 2 c coffee, 1 tbsp 2% milk, 8 oz water (over 2 hrs)  - Walked 1.5 mile -  Snk (9 AM)-    3 homemade oatmeal-raisin bites; 175 mg Ca suppl,  c mixedberry&apple compote, 2 tbsp plain f-f Grk yogurt,  orange, 16 oz water   Personal trainer workout (upper body) -  L (12 PM)-   4 homemade oatmeal-raisin bites (5=12 mg Ca, 32 mg Na, 1.3 g pro), 175 mg Ca suppl,  c veg's, 2 tbsp blk beans, 1/4 c salad greens Snk ( PM)-      --- D (7:30 PM)-   Restaurant: 1 spring roll, 1 egg roll, 3 oz tofu, 1 c veg's, 1 1/2 tsp rice, hot tea Snk (9 PM)-    3 low-salt Triscuits, 175 mg Ca suppl Typical day? Yes, except that seldom eats dinner out.    Nutritional Diagnosis:  NI-1.4 Inadequate energy intake As related to longstanding restrictive eating.  As evidenced by 24-hr recall indicating an intake of ~1,000 kcal despite exercising an average of 1 3/4 hrs/day .  Handouts given during visit include: After-Visit Summary (AVS)  Demonstrated degree of understanding via:  Teach Back  Barriers to learning/adherence to lifestyle change: Longstanding dietary and exercise patterns.    For behavioral goals and recommendations, see Patient Instructions.    Monitoring/Evaluation:  Dietary intake, exercise, and body weight in 2 week(s).

## 2023-06-09 NOTE — Patient Instructions (Addendum)
Recommendations:  When you are using beans as your protein source, consider 1 full cup of beans to be the necessary portion size to get a meaningful amount of protein at that meal.  Plant-based protein is probably less acidic than most animal protein sources, so is associated with less risk of excessive Ca excretion.    Start your day with your Ca supplement, predominant source of which is Ca citrate malate.  This doesn't require food for good absorption.   Water intake: Ounces equal to half your weight in pounds is a good starting point.  60 oz/day is probably a better target with all of your exercise.  Exercise:  I applaud your cutting back to only 2 "workouts" per day.  You are absolutely right that you have to allow recovery time to be able to build either muscle or bone, and to adequately replenish glycogen levels.  As we age, recovery takes longer, and muscle protein synthesis is somewhat compromised.  Although it is challenging, do your best to listen to your body, and when you are feeling fatigued, take the day off from exercise or only do mild walking.  Your body is usually much smarter than your brain knowing when and how much for both food and exercise.    Muscle protein synthesis: Requires adequate amounts of both protein AND carbohydrate as an energy source.  I am concerned that you are not meeting your energy needs.  A weight of at least 108 (100 would be better) is desirable for both bone density and muscle mass and strength.   - Specific recommendation:  Include at least 2 portions of a carb food with each meal.   ONE portion of a starchy food is equal to the following:  - ONE slice of bread (or its equivalent, such as half of a hamburger bun).  - 1/2 cup of a "scoopable" starchy food such as potatoes or rice.  - 15 grams of Total Carbohydrate as shown on food label.   With a limited energy intake, your dietary protein requirement is actually greater.    I will message Dr.  Lonzo Cloud to ask for an order for another urinary Ca, so you don't go a full 6 months on this restrictive diet.    Let me know if you have questions as you work with these recommendations.

## 2023-06-10 DIAGNOSIS — M542 Cervicalgia: Secondary | ICD-10-CM | POA: Diagnosis not present

## 2023-06-16 ENCOUNTER — Telehealth: Payer: Self-pay | Admitting: Internal Medicine

## 2023-06-16 DIAGNOSIS — M810 Age-related osteoporosis without current pathological fracture: Secondary | ICD-10-CM

## 2023-06-16 NOTE — Telephone Encounter (Signed)
LMTCB

## 2023-06-16 NOTE — Telephone Encounter (Signed)
Please contact the patient and let her know that the dietitian would feel more comfortable if the patient had a repeat 24-hour urinary calcium    Orders have been placed, please schedule the patient   Thanks

## 2023-06-16 NOTE — Telephone Encounter (Signed)
----- Message from [facility] sent at 06/14/2023  9:50 AM EST ----- Regarding: RE: 24-hr urine order Not at all.  You're right that the processed meats are especially high in Na, and a high-Na diet increases Ca excretion.  Excessive dietary protein, however, independent of Na, may also promote urinary Ca losses when the diet is inadequate in both energy and Ca, characteristic of our patient's diet.  There is quite a lot of evidence in recent yrs that protein benefits bone density, promoting IGF-1, and in some contexts, even boosting Ca absorption.   I would like to see if her dietary changes have resulted in a normal urinary Ca.  If so, she is an educated person (a Education officer, community), and will understand - and I think adhere to - dietary recommendations to keep Na intake low.  Your thoughts? Jeannie ----- Message ----- From: Orland Penman, MD Sent: 06/14/2023   8:40 AM EST To: Linna Darner, RD Subject: RE: 24-hr urine order                          Are you saying that ALL proteins have high sodium content ? ----- Message ----- From: Linna Darner, RD Sent: 06/13/2023   4:06 PM EST To: Orland Penman, MD Subject: RE: 24-hr urine order                          Sorry to push back, but I may have misunderstood your objective in getting the urine Ca in the first place.  I am completely on board with restricting Na intake to minimize Ca excretion; I am just concerned about a prolonged restricted protein intake, which in itself can be detrimental to bone.  Is it not reasonable to add back some dietary protein if urinary Ca is back in normal range after Na restriction, so she doesn't have to have 6 months of inadequate protein, especially in the context of her energy deficit?  (Note: 24-hr food recall suggested an intake of only 1000 kcal, and she exercises 1 3/4 hr per day on average!)    Thx,   Jeannie ----- Message ----- From: Orland Penman, MD Sent: 06/13/2023    1:40 PM EST To: Linna Darner, RD Subject: RE: 24-hr urine order                          Thank you for reaching out   Changes in urine calcium excretion could happen within a few weeks if not days of making the dietary changes,, I believe the patient has already made changes over the past 4 weeks and repeating the 24-hour urine is not going to be helpful, because if it is normal,  I am afraid that the patient will go back to a normal diet and by the next checkup will be high again.    Limiting salty foods and proteins that are rich in salt should not create any dietary restrictions on the patient and I do believe this is why she is seeing to give her alternatives, for example instead of having bacon as a salty red meat option, patient may choose tofu as a protein replacement.  The patient may continue to have fat, starch, and vegetables as a source of nutrition without having to be restrictive   Abby Raelyn Mora, MD  [facility] Endocrinology  [facility] [address] [place]  [address] [place], [place] [address] Phone: [phone] FAX: [phone] ----- Message ----- From: Linna Darner, RD Sent: 06/09/2023   6:02 PM EST To: Orland Penman, MD Subject: 24-hr urine order                              Dr. Lonzo Cloud,   I saw Lurena Joiner for medical nutrition therapy (MNT) today.  I am messaging you to ask if you will order a repeat 24-hr urinary Ca.  You had suggested a follow-up urine Ca at 6 months, but I'm concerned that her current restrictive diet, which is is inadequate for both energy and protein, may contribute to greater muscle (and even bone) loss.    It was reassuring that her vit D and PTH were in normal range, but can we check that 24-hr urine Ca to see if the first measurement was an aberration, and if it's reasonable to get her protein levels back up?  (I see no problem with continuing to restrict Na intake.  Energy intake also needs to be higher, but  that is a separate topic I hope to continue to work with her on.)  Please let me (or Maitri directly) know, so she knows when she can start the urine collection.    Thanks,   Wyona Almas, PhD, RD, LDN

## 2023-06-17 ENCOUNTER — Other Ambulatory Visit: Payer: PPO

## 2023-06-17 NOTE — Telephone Encounter (Signed)
LDTVM

## 2023-06-20 DIAGNOSIS — M542 Cervicalgia: Secondary | ICD-10-CM | POA: Diagnosis not present

## 2023-06-21 ENCOUNTER — Other Ambulatory Visit: Payer: PPO

## 2023-06-21 ENCOUNTER — Other Ambulatory Visit: Payer: Self-pay

## 2023-06-21 DIAGNOSIS — M810 Age-related osteoporosis without current pathological fracture: Secondary | ICD-10-CM

## 2023-06-22 LAB — CALCIUM, URINE, 24 HOUR: Calcium, 24H Urine: 210 mg/(24.h)

## 2023-06-22 LAB — CREATININE, URINE, 24 HOUR: Creatinine, 24H Ur: 0.8 g/(24.h) (ref 0.50–2.15)

## 2023-06-27 ENCOUNTER — Ambulatory Visit (INDEPENDENT_AMBULATORY_CARE_PROVIDER_SITE_OTHER): Payer: PPO | Admitting: *Deleted

## 2023-06-27 ENCOUNTER — Ambulatory Visit (INDEPENDENT_AMBULATORY_CARE_PROVIDER_SITE_OTHER): Payer: Self-pay | Admitting: Family Medicine

## 2023-06-27 VITALS — Wt 88.0 lb

## 2023-06-27 DIAGNOSIS — M810 Age-related osteoporosis without current pathological fracture: Secondary | ICD-10-CM

## 2023-06-27 DIAGNOSIS — T63441D Toxic effect of venom of bees, accidental (unintentional), subsequent encounter: Secondary | ICD-10-CM | POA: Diagnosis not present

## 2023-06-27 NOTE — Progress Notes (Signed)
Medical Nutrition Therapy PCP Candis Shine, MD Endocrinologist Ibtehal "Abby" Georgette Dover, MD Gastroenterologist Judie Petit, MD  Appt start time: 1400 end time: 1430 (30 minutes)    Relevant history/background: Tammy Shields is interested in nutrition guidance related to osteoporosis.  Her most recent DXA scan (May 2023) showed a spine T-score of -3.7; L femur neck -2.7.  Complicating treatment decisions re. osteoporosis has been elevated 24-hr urinary calcium.  05/16/23 urinary Ca was 306 mg/24 hr (normal range=35-250), after which, following Dr. Harvel Ricks recommendation, she reduced intake of both sodium and protein, and reduced total (dietary and supplemental) Ca intake.  24-hr urine on 06/21/23 showed Ca in normal range, 210 mg.   Tammy Shields has a h/o GI problems, which sometimes result in abdomen pain leading to very low intake for days at a time.   Her weight has remained below 100 lb since losing from ~110 lb ~20 yrs ago when she followed a modified keto diet.  She grew up in a family where "everyone was on a diet."  In 2006, she had a slap tear in her L shoulder, which limited her exercise to using the elliptical for some time.  Over time, exercise increased to significant time investment and average steps of 20-30,000/day (see below), but she has recently responded to fatigue by reducing walking some and eliminating 2 X 60-min exercise classes.    Assessment:  With her 11/26 urinary Ca being in normal range, Tammy Shields has started to increase dietary protein and Ca again, although she continues to limit processed meats and other sources of Na.  About half of her protein sources are of plant origin.  By Tammy Shields's calculation, intake from Nov 2-30 has averaged 433 mg dietary Ca, 1087 mg of total (suppl & food) Ca, 765 mg Na (range [phone]), and 45 g (range 25-66) protein.  She maintains supplemental Ca at four 175-mg (700 mg/day).    Learning Readiness: Change in progress; Tammy Shields has meticulously  followed Dr. Harvel Ricks recommendations for Na, Ca, and protein intakes.    Usual eating pattern: 3 meals and 1-2 snacks per day. Weight: 88 lb (self-rept); wt on 05/13/23 was 98.4 lb; ht is 64".   Frequent foods and beverages: water, 2 c coffee w/ 1 tbsp 2% milk; homemade oatmeal raisin bites; 2-3 tbsp plain fat-free Grk yogurt, low-salt Triscuits, mixed nuts, peanuts, fish, chx, blueberry compote. Avoided foods: mayonnaise, most bread, rice, pasta, potatoes, black beans, high-sodium, fast foods.    Usual physical activity:  Averages 1 3/4 hrs exercise/day: ~1.5-mi walk daily; 60-min walk 5 X wk; 60-min body pump 2-3 X wk; 60-min [place] dance class 2 X wk; 60-min strength workout with personal trainer 2 X wk.  Averages 18,000 steps/day (previously 20-30,000/day).   Sleep: Estimates ~6 hours/night.  Usual bedtime 10 PM; usually up at 5 or 6 AM.   Food security -  Within the past 12 months, did you run out or worry that you would run out of food, and not have money to get more?  No.    No food recall today.    Nutritional Diagnosis: No progress on NI-1.4 Inadequate energy intake As related to longstanding restrictive eating.  As evidenced by weight loss of at least 3 lb in past 3 weeks (per patient self-report).  Handouts given during visit include: After-Visit Summary (AVS)  Barriers to learning/adherence to lifestyle change: Longstanding dietary and exercise patterns.    For behavioral goals and recommendations, see Patient Instructions.    Monitoring/Evaluation:  Dietary intake,  exercise, and body weight  [facility] .

## 2023-06-27 NOTE — Patient Instructions (Addendum)
Nutritional balance for optimal bone health and Ca retention:  - Optimal bone health requires adequate protein intake (recent studies suggest at least 0.9 g protein/kg body weight, with some studies suggesting as much as 1.2 g/kg; protein RDA is 0.8 g/kg).  Excessive protein intake, however (esp animal protein) can cause urinary Ca excretion as the body tries to buffer the acidity of a high-protein diet.   - Consuming adequate Ca can counter a high-protein diet. In addition, a diet that provides a lot of fruits and veg's tends to buffer the acidity (e.g., K+ content), again sparing skeletal Ca.  (Keep in mind that taking probiotics will be a waste of money if you don't simultaneously feed them.) - When energy sources are limited/inadequate, protein needs actually go up.  Even if you were to eat nothing but protein, in the setting of insufficient energy, the protein consumed will simply go to meeting energy needs before protein needs.  Energy, second only to water, is the body's most important (prioritized) nutrient.    Na intake: A good target to stay below is probably about 1200 mg.  At your 48-month f/u with Dr. Lonzo Cloud, assuming you do another 24-hr urine Ca, if it is still in normal range with this amount of Na, you should be able to increase (if desired) to ~1500 mg/day.    Protein intake: 50 g of protein/day = 1 g protein per kg body at 110 kcal.  Another way to look at this dietary goal is to aim for ~20 grams of protein per meal.  Remember that when you use beans as your protein source at a meal, the minimal portion size for a meaningful amount of protein is 1 full cup (15 g).    Carbohydrate intake: I recommend you get at least 2 starch portions per meal.  Keep some more fresh fruit on hand for snacks (and remember that 4-5 prunes/day have been associated with better bone density).   Carbohydrate includes starch, sugar, and fiber.  Of these, only sugar and starch raise blood glucose.  (Fiber is  found in fruits, vegetables [especially skin, seeds, and stalks], whole grains, and beans.)   Starchy (carb) foods: Bread, rice, pasta, white or sweet potatoes, corn, cereal, grits, crackers, bagels, muffins, all baked goods.  (Fruit, milk, and yogurt also have carbohydrate, but most of these foods will not spike your blood sugar as much as most starchy or sweet foods will, so are not categorized as starches.)   Eating 3 discrete meals and 1-2 snacks per day is a way to help meet nutritional needs.   Aim for no more than 5 hours between eating.   A REAL breakfast includes at least some protein and some starch (fruit, an excellent addition).   A REAL lunch or dinner includes at least some protein, some starch, and vegetables and/or fruit.   (OR: Would you serve this to a guest in your home, and call it a meal?)  Although you have had GI discomfort for a long time (including before you lost weight), abdominal discomfort after eating is not uncommon to have following a new food or larger amount of food than usual after a prolonged period of food restriction. This is b/c the GI system needs to adapt to normal eating again.   - Think of your GI tract as a muscular system that gets "de-trained" with food restriction.   - During an energy deficit, the body reduces production of anything not acutely needed for survival, including  enzymes, neurotransmitters, and hormones involved in digestion.  As you increase variety and volume of food, your body needs to start ramping up production of these substances, which means digestion functions at lower capacity until the body adapts.   - Lastly, when you starve yourself through restrictive eating, you are also starving your gut microbes.  Just as it takes some time to increase production of digestive substances, it takes time for gut microbes to colonize and multiply, ultimately fulfilling a very important role in healthy digestion.   After a period of food restriction,  it is usually necessary to "eat beyond discomfort" as the body re-adapts to normal food intake.    Follow-up:  If you want to follow up with [facility] at [facility] ([address] [place]) following my retirement, they will call you to schedule an appt after receiving a physician referral.

## 2023-07-05 DIAGNOSIS — M542 Cervicalgia: Secondary | ICD-10-CM | POA: Diagnosis not present

## 2023-07-15 DIAGNOSIS — H18832 Recurrent erosion of cornea, left eye: Secondary | ICD-10-CM | POA: Diagnosis not present

## 2023-07-22 DIAGNOSIS — M542 Cervicalgia: Secondary | ICD-10-CM | POA: Diagnosis not present

## 2023-07-25 ENCOUNTER — Telehealth: Payer: Self-pay | Admitting: Internal Medicine

## 2023-07-25 NOTE — Telephone Encounter (Unsigned)
Copied from CRM [id number]. Topic: Clinical - Request for Lab/Test Order >> Jul 25, 2023 10:25 AM Gaetano Hawthorne wrote: Reason for CRM: Patient would like to have her A1c tested on Thursday with her lab if possible. Please call her with any questions - she's had a recent change in her diet.

## 2023-07-28 ENCOUNTER — Other Ambulatory Visit: Payer: PPO

## 2023-07-28 DIAGNOSIS — M810 Age-related osteoporosis without current pathological fracture: Secondary | ICD-10-CM | POA: Diagnosis not present

## 2023-07-28 DIAGNOSIS — Z0000 Encounter for general adult medical examination without abnormal findings: Secondary | ICD-10-CM

## 2023-07-28 DIAGNOSIS — M542 Cervicalgia: Secondary | ICD-10-CM | POA: Diagnosis not present

## 2023-07-28 DIAGNOSIS — Z13220 Encounter for screening for lipoid disorders: Secondary | ICD-10-CM | POA: Diagnosis not present

## 2023-07-28 NOTE — Progress Notes (Addendum)
 Annual Wellness Visit   Patient Care Team: [name], MD as PCP - General (Internal Medicine)  Visit Date: 07/29/23   Chief Complaint  Patient presents with   Annual Exam   Medicare Wellness   Subjective:  Patient: Tammy Shields, DDS, Female DOB: September 11, 1955, 68 y.o. 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  Has been changing her diet over the past year due to GI issues she's been having over the past year. Calculating sodium, calcium, and protein since November.   HgbA1c of 5.8. CBC: WBC 2.6, absolute lymphocytes 710. CMP WNL. Lipid Panel: 219 cholesterol, LDL 105. TSH 0.89.   PAP Smear 05/29/21 was normal with no repeat recommendation due to aging out.   Mammogram 09/29/21 was normal overall, though noting category density C.  Colonoscopy 09/07/13 with removal of sessile polyp measuring 4 mm in the sigmoid colon, removal of sessile polyp measuring 5 mm in the rectum, and moderate internal hemorrhoids. Repeat recommendation of 2025.  Bone DEXA 11/24/21 with Tscore of -3.7, indicating osteoporosis, with repeat recommendation of 2025. Vitamin-D 05/23/23  36.92. Discussed starting Prolia  and adding Vitamin-D.   Vaccine Counseling: Due for Covid-19, Flu, PNA, and Shingles; UTD on Tdap  Past Medical History:  Diagnosis Date   Basal cell carcinoma 2008   Bulging of cervical intervertebral disc    Carpal tunnel syndrome on right    Finger numbness    Hymenoptera allergy    Osteoporosis    Postmenopausal    Raynaud's disease    self diagnosis    Family History  Problem Relation Age of Onset   Heart disease Mother    Diabetes Mother    Lung cancer Mother    Hypertension Mother    Prostate cancer Father    Hypertension Sister    Diabetes Brother    Colon cancer Neg Hx     Stomach cancer Neg Hx    Esophageal cancer Neg Hx     Social History   Social History Narrative   Not on file   Review of Systems  Constitutional:  Negative for chills, fever, malaise/fatigue and weight loss.  HENT:  Negative for hearing loss, sinus pain and sore throat.   Respiratory:  Negative for cough, hemoptysis and shortness of breath.   Cardiovascular:  Negative for chest pain, palpitations, leg swelling and PND.  Gastrointestinal:  Negative for abdominal pain, constipation, diarrhea, heartburn, nausea and vomiting.  Genitourinary:  Negative for dysuria, frequency and urgency.  Musculoskeletal:  Negative for back pain, myalgias and neck pain.  Skin:  Negative for itching and rash.  Neurological:  Negative for dizziness, tingling, seizures and headaches.  Endo/Heme/Allergies:  Negative for polydipsia.  Psychiatric/Behavioral:  Negative for depression. The patient is not nervous/anxious.     Objective:  Vitals: BP 110/80   Pulse 68   Ht 5' 4 (1.626 m)   Wt 91 lb (41.3 kg)   SpO2 98%   BMI 15.62 kg/m  Physical Exam Vitals and nursing note reviewed.  Constitutional:      General: She is not in acute distress.    Appearance: Normal appearance. She is not ill-appearing or toxic-appearing.  HENT:     Head: Normocephalic and atraumatic.     Right Ear: Hearing, tympanic membrane, ear canal and external ear normal.  Left Ear: Hearing, tympanic membrane, ear canal and external ear normal.     Mouth/Throat:     Pharynx: Oropharynx is clear.  Eyes:     Extraocular Movements: Extraocular movements intact.     Pupils: Pupils are equal, round, and reactive to light.  Neck:     Thyroid : No thyroid  mass, thyromegaly or thyroid  tenderness.     Vascular: No carotid bruit.  Cardiovascular:     Rate and Rhythm: Normal rate and regular rhythm. No extrasystoles are present.    Pulses:          Dorsalis pedis pulses are 1+ on the right side and 1+ on the left side.     Heart  sounds: Normal heart sounds. No murmur heard.    No friction rub. No gallop.  Pulmonary:     Effort: Pulmonary effort is normal.     Breath sounds: Normal breath sounds. No decreased breath sounds, wheezing, rhonchi or rales.  Chest:     Chest wall: No mass.  Abdominal:     Palpations: Abdomen is soft. There is no hepatomegaly, splenomegaly or mass.     Tenderness: There is no abdominal tenderness.     Hernia: No hernia is present.  Musculoskeletal:     Cervical back: Normal range of motion.     Right lower leg: No edema.     Left lower leg: No edema.  Lymphadenopathy:     Cervical: No cervical adenopathy.     Upper Body:     Right upper body: No supraclavicular adenopathy.     Left upper body: No supraclavicular adenopathy.  Skin:    General: Skin is warm and dry.  Neurological:     General: No focal deficit present.     Mental Status: She is alert and oriented to person, place, and time. Mental status is at baseline.     Sensory: Sensation is intact.     Motor: Motor function is intact. No weakness.     Deep Tendon Reflexes: Reflexes are normal and symmetric.  Psychiatric:        Attention and Perception: Attention normal.        Mood and Affect: Mood normal.        Speech: Speech normal.        Behavior: Behavior normal.        Thought Content: Thought content normal.        Cognition and Memory: Cognition normal.        Judgment: Judgment normal.    Most recent functional status assessment:    07/29/2023   10:54 AM  In your present state of health, do you have any difficulty performing the following activities:  Hearing? 0  Vision? 0  Difficulty concentrating or making decisions? 0  Walking or climbing stairs? 0  Dressing or bathing? 0  Doing errands, shopping? 0  Preparing Food and eating ? N  Using the Toilet? N  In the past six months, have you accidently leaked urine? N  Do you have problems with loss of bowel control? N  Managing your Medications? N   Managing your Finances? N  Housekeeping or managing your Housekeeping? N   Most recent fall risk assessment:    07/29/2023   10:55 AM  Fall Risk   Falls in the past year? 0  Number falls in past yr: 0  Injury with Fall? 0  Risk for fall due to : No Fall Risks  Follow up Falls prevention discussed;Education provided;Falls  evaluation completed    Most recent depression screenings:    07/29/2023   11:04 AM 10/26/2022    3:43 PM  PHQ 2/9 Scores  PHQ - 2 Score 0 0   Most recent cognitive screening:    07/29/2023   11:04 AM  6CIT Screen  What Year? 0 points  What month? 0 points  What time? 0 points  Count back from 20 0 points  Months in reverse 0 points  Repeat phrase 0 points  Total Score 0 points   Results:  Studies obtained and personally reviewed by me:  PAP Smear 05/29/21 was normal with no repeat recommendation due to aging out.   Mammogram 09/29/21 was normal overall, though noting category density C.  Colonoscopy 09/07/13 with removal of sessile polyp measuring 4 mm in the sigmoid colon, removal of sessile polyp measuring 5 mm in the rectum, and moderate internal hemorrhoids  Bone DEXA ASSESSMENT: The BMD measured at AP Spine L1-L4 is 0.735 g/cm2 with a T-score of -3.7. This patient is considered osteoporotic according to World Health Organization [facility]) criteria.  Site Region Measured Date Measured Age YA BMD Significant CHANGE T-score AP Spine  L1-L4      11/24/2021    67.9         -3.7    0.735 g/cm2 AP Spine  L1-L4      09/25/2019    65.8         -3.6    0.753 g/cm2   DualFemur Neck Left  11/24/2021    67.9         -2.7    0.657 g/cm2 DualFemur Neck Left  09/25/2019    65.8         -2.8    0.652 g/cm2   DualFemur Total Mean 11/24/2021    67.9         -2.4    0.710 g/cm2 DualFemur Total Mean 09/25/2019    65.8         -2.4    0.706 g/cm2  Labs:     Component Value Date/Time   NA 138 07/28/2023 0911   NA 141 03/01/2019 0917   K 5.1 07/28/2023 0911   CL 101  07/28/2023 0911   CO2 30 07/28/2023 0911   GLUCOSE 96 07/28/2023 0911   BUN 15 07/28/2023 0911   BUN 21 03/01/2019 0917   CREATININE 0.67 07/28/2023 0911   CALCIUM 9.6 07/28/2023 0911   PROT 6.7 07/28/2023 0911   PROT 6.1 03/01/2019 0917   ALBUMIN 4.5 05/13/2023 1126   ALBUMIN 4.4 03/01/2019 0917   AST 22 07/28/2023 0911   ALT 16 07/28/2023 0911   ALKPHOS 70 03/01/2019 0917   BILITOT 0.3 07/28/2023 0911   BILITOT 0.2 03/01/2019 0917   GFRNONAA 91 06/27/2020 0905   GFRAA 105 06/27/2020 0905    Lab Results  Component Value Date   WBC 2.6 (L) 07/28/2023   HGB 13.9 07/28/2023   HCT 41.8 07/28/2023   MCV 95.7 07/28/2023   PLT 201 07/28/2023   Lab Results  Component Value Date   CHOL 219 (H) 07/28/2023   HDL 101 07/28/2023   LDLCALC 105 (H) 07/28/2023   TRIG 50 07/28/2023   CHOLHDL 2.2 07/28/2023   Lab Results  Component Value Date   HGBA1C 5.8 (H) 07/28/2023    Lab Results  Component Value Date   TSH 0.89 07/28/2023   Assessment & Plan:   PAP Smear: 05/29/21 was normal. Pelvic exam deferred to her GYN.  Mammogram: 09/29/21 was normal overall, though noting category density C. Repeat recommended for 2025.  Colonoscopy: 09/07/13 with removal of sessile polyp measuring 4 mm in the sigmoid colon, removal of sessile polyp measuring 5 mm in the rectum, and moderate internal hemorrhoids. Repeat recommendation of 2025.  Bone DEXA: 11/24/21 with Tscore of -3.7, indicating osteoporosis, with repeat recommendation of 2025. Discussed starting Prolia  and adding Vitamin-D. Sending in  50,000 units Vitamin-D - take once a week.  Vitamin D  level in October was 36.92 Referral to Dr. Sam to discuss options for treatment for osteoporosis. Will see Dr. Cleotilde, GYN soon as well.  History of insect sting allergy- underwent desensitization by Dr. Orlan. Has Epipen .  Vaccine Counseling: Due for Covid-19, Flu, PNA, and Shingles; UTD on Tdap    Annual wellness visit done today including  the all of the following: Reviewed patient's Family Medical History Reviewed and updated list of patient's medical providers Assessment of cognitive impairment was done Assessed patient's functional ability Established a written schedule for health screening services Health Risk Assessent Completed and Reviewed  Discussed health benefits of physical activity, and encouraged her to engage in regular exercise appropriate for her age and condition.        I,Emily Lagle,acting as a neurosurgeon for Ronal JINNY Hailstone, MD.,have documented all relevant documentation on the behalf of Ronal JINNY Hailstone, MD,as directed by  Ronal JINNY Hailstone, MD while in the presence of Ronal JINNY Hailstone, MD.   I, Ronal JINNY Hailstone, MD, have reviewed all documentation for this visit. The documentation on 08/07/23 for the exam, diagnosis, procedures, and orders are all accurate and complete.

## 2023-07-28 NOTE — Addendum Note (Signed)
 Addended by: Gregery Na on: 07/28/2023 09:20 AM   Modules accepted: Orders

## 2023-07-29 ENCOUNTER — Encounter: Payer: PPO | Admitting: Internal Medicine

## 2023-07-29 ENCOUNTER — Encounter: Payer: Self-pay | Admitting: Internal Medicine

## 2023-07-29 ENCOUNTER — Ambulatory Visit: Payer: PPO | Admitting: Internal Medicine

## 2023-07-29 VITALS — BP 110/80 | HR 68 | Ht 64.0 in | Wt 91.0 lb

## 2023-07-29 DIAGNOSIS — Z91038 Other insect allergy status: Secondary | ICD-10-CM | POA: Diagnosis not present

## 2023-07-29 DIAGNOSIS — M818 Other osteoporosis without current pathological fracture: Secondary | ICD-10-CM | POA: Diagnosis not present

## 2023-07-29 DIAGNOSIS — D708 Other neutropenia: Secondary | ICD-10-CM

## 2023-07-29 DIAGNOSIS — Z0000 Encounter for general adult medical examination without abnormal findings: Secondary | ICD-10-CM | POA: Diagnosis not present

## 2023-07-29 DIAGNOSIS — K635 Polyp of colon: Secondary | ICD-10-CM | POA: Diagnosis not present

## 2023-07-29 LAB — POCT URINALYSIS DIP (CLINITEK)
Bilirubin, UA: NEGATIVE
Blood, UA: NEGATIVE
Glucose, UA: NEGATIVE mg/dL
Ketones, POC UA: NEGATIVE mg/dL
Leukocytes, UA: NEGATIVE
Nitrite, UA: NEGATIVE
POC PROTEIN,UA: NEGATIVE
Spec Grav, UA: 1.01 (ref 1.010–1.025)
Urobilinogen, UA: 0.2 U/dL
pH, UA: 7 (ref 5.0–8.0)

## 2023-07-29 LAB — COMPLETE METABOLIC PANEL WITH GFR
AG Ratio: 2.4 (calc) (ref 1.0–2.5)
ALT: 16 U/L (ref 6–29)
AST: 22 U/L (ref 10–35)
Albumin: 4.7 g/dL (ref 3.6–5.1)
Alkaline phosphatase (APISO): 95 U/L (ref 37–153)
BUN: 15 mg/dL (ref 7–25)
CO2: 30 mmol/L (ref 20–32)
Calcium: 9.6 mg/dL (ref 8.6–10.4)
Chloride: 101 mmol/L (ref 98–110)
Creat: 0.67 mg/dL (ref 0.50–1.05)
Globulin: 2 g/dL (ref 1.9–3.7)
Glucose, Bld: 96 mg/dL (ref 65–99)
Potassium: 5.1 mmol/L (ref 3.5–5.3)
Sodium: 138 mmol/L (ref 135–146)
Total Bilirubin: 0.3 mg/dL (ref 0.2–1.2)
Total Protein: 6.7 g/dL (ref 6.1–8.1)
eGFR: 95 mL/min/{1.73_m2} (ref >=60)

## 2023-07-29 LAB — LIPID PANEL
Cholesterol: 219 mg/dL — ABNORMAL HIGH (ref <200)
HDL: 101 mg/dL (ref >=50)
LDL Cholesterol (Calc): 105 mg/dL — ABNORMAL HIGH
Non-HDL Cholesterol (Calc): 118 mg/dL (ref <130)
Total CHOL/HDL Ratio: 2.2 (calc) (ref <5.0)
Triglycerides: 50 mg/dL (ref <150)

## 2023-07-29 LAB — CBC WITH DIFFERENTIAL/PLATELET
Absolute Lymphocytes: 710 {cells}/uL — ABNORMAL LOW (ref 850–3900)
Absolute Monocytes: 234 {cells}/uL (ref 200–950)
Basophils Absolute: 42 {cells}/uL (ref 0–200)
Basophils Relative: 1.6 %
Eosinophils Absolute: 42 {cells}/uL (ref 15–500)
Eosinophils Relative: 1.6 %
HCT: 41.8 % (ref 35.0–45.0)
Hemoglobin: 13.9 g/dL (ref 11.7–15.5)
MCH: 31.8 pg (ref 27.0–33.0)
MCHC: 33.3 g/dL (ref 32.0–36.0)
MCV: 95.7 fL (ref 80.0–100.0)
MPV: 10.5 fL (ref 7.5–12.5)
Monocytes Relative: 9 %
Neutro Abs: 1573 {cells}/uL (ref 1500–7800)
Neutrophils Relative %: 60.5 %
Platelets: 201 10*3/uL (ref 140–400)
RBC: 4.37 10*6/uL (ref 3.80–5.10)
RDW: 12.7 % (ref 11.0–15.0)
Total Lymphocyte: 27.3 %
WBC: 2.6 10*3/uL — ABNORMAL LOW (ref 3.8–10.8)

## 2023-07-29 LAB — HEMOGLOBIN A1C
Hgb A1c MFr Bld: 5.8 %{Hb} — ABNORMAL HIGH (ref <5.7)
Mean Plasma Glucose: 120 mg/dL
eAG (mmol/L): 6.6 mmol/L

## 2023-07-29 LAB — TSH: TSH: 0.89 m[IU]/L (ref 0.40–4.50)

## 2023-07-29 MED ORDER — VITAMIN D (ERGOCALCIFEROL) 1.25 MG (50000 UNIT) PO CAPS: 50000.0000 [IU] | ORAL_CAPSULE | ORAL | 3 refills | Status: DC

## 2023-07-29 MED ORDER — ERGOCALCIFEROL 1.25 MG (50000 UT) PO CAPS: 50000.0000 [IU] | ORAL_CAPSULE | ORAL | 3 refills | Status: DC

## 2023-08-05 ENCOUNTER — Telehealth: Payer: Self-pay | Admitting: Internal Medicine

## 2023-08-05 NOTE — Telephone Encounter (Signed)
 I sent a request for Prolia to [employer] and they sent back that patient insurance [employer] advises that they will not release benefit information to a third party to contact them directly at [phone]  [employer] support ID # [id number]

## 2023-08-07 NOTE — Patient Instructions (Addendum)
 Repeat colonoscopy due 2025. Recommend Prolia  q 6 months  for osteoporosis. T score is -3.7.  Recommend high dose (50,000 units) Vitamin D  weekly. Vaccines discussed. Consider coronary calcium scoring to assess presence of heart disease. WBC is stable. She Will see Dr. Cleotilde in April and Dr. Sam in May to discuss osteoporosis treatment. Needs repeat colonoscopy 2025.She would like to return here in one year.

## 2023-08-16 DIAGNOSIS — M542 Cervicalgia: Secondary | ICD-10-CM | POA: Diagnosis not present

## 2023-08-22 ENCOUNTER — Other Ambulatory Visit (HOSPITAL_COMMUNITY): Payer: Self-pay

## 2023-08-22 ENCOUNTER — Telehealth: Payer: Self-pay

## 2023-08-22 NOTE — Telephone Encounter (Signed)
Prolia BIV in separate encounter.

## 2023-08-22 NOTE — Telephone Encounter (Signed)
Pt ready for scheduling for PROLIA on or after : 08/22/23  Out-of-pocket cost due at time of visit: $332  Number of injection/visits approved: ---  Primary: [employer] Prolia co-insurance: 20% Admin fee co-insurance: 0%  Secondary: --- Prolia co-insurance:  Admin fee co-insurance:   Medical Benefit Details: Date Benefits were checked: 08/04/23 Deductible: NO/ Coinsurance: 20%/ Admin Fee: 0%  Prior Auth: N/A PA# Expiration Date:   # of doses approved:  Pharmacy benefit: Copay $250 If patient wants fill through the pharmacy benefit please send prescription to: [employer], and include estimated need by date in rx notes. Pharmacy will ship medication directly to the office.  Patient NOT eligible for Prolia Copay Card. Copay Card can make patient's cost as little as $25. Link to apply: [email or web address]://[email or web address]  ** This summary of benefits is an estimation of the patient's out-of-pocket cost. Exact cost may very based on individual plan coverage.

## 2023-08-30 DIAGNOSIS — M542 Cervicalgia: Secondary | ICD-10-CM | POA: Diagnosis not present

## 2023-09-13 DIAGNOSIS — M542 Cervicalgia: Secondary | ICD-10-CM | POA: Diagnosis not present

## 2023-09-19 ENCOUNTER — Ambulatory Visit (INDEPENDENT_AMBULATORY_CARE_PROVIDER_SITE_OTHER): Payer: PPO

## 2023-09-19 DIAGNOSIS — T63441D Toxic effect of venom of bees, accidental (unintentional), subsequent encounter: Secondary | ICD-10-CM

## 2023-09-27 DIAGNOSIS — M542 Cervicalgia: Secondary | ICD-10-CM | POA: Diagnosis not present

## 2023-09-30 ENCOUNTER — Other Ambulatory Visit (HOSPITAL_COMMUNITY): Payer: Self-pay

## 2023-09-30 ENCOUNTER — Other Ambulatory Visit: Payer: Self-pay

## 2023-09-30 MED ORDER — DENOSUMAB 60 MG/ML ~~LOC~~ SOSY
60.0000 mg | PREFILLED_SYRINGE | SUBCUTANEOUS | 0 refills | Status: DC
  Filled 2023-10-05: qty 1, 180d supply, fill #0

## 2023-10-03 ENCOUNTER — Other Ambulatory Visit: Payer: Self-pay

## 2023-10-03 ENCOUNTER — Other Ambulatory Visit (HOSPITAL_COMMUNITY): Payer: Self-pay

## 2023-10-03 ENCOUNTER — Telehealth: Payer: Self-pay | Admitting: Internal Medicine

## 2023-10-03 ENCOUNTER — Other Ambulatory Visit: Payer: Self-pay | Admitting: Pharmacy Technician

## 2023-10-03 NOTE — Telephone Encounter (Signed)
 Called patient back to let her know that any one of them would be great, just depends on if she would like female or female doctor. She said thanks and she would call and make an appointment.

## 2023-10-03 NOTE — Telephone Encounter (Signed)
 Copied from CRM [id number]. Topic: Referral - Question >> Oct 03, 2023  9:56 AM Ivette P wrote: Reason for CRM: Pt called in because wanted to speak to Burna Mortimer, Pt has a referral to [facility] and the dr referred to now has retired and Pt needs a new referral. PT would like a callback [phone]

## 2023-10-03 NOTE — Progress Notes (Signed)
 Pharmacy Patient Advocate Encounter  Insurance verification completed.   The patient is insured through [facility] ADVANTAGE/RX ADVANCE   Ran test claim for Prolia. Currently a quantity of 1 ml is a 180 day supply and the co-pay is $250.  This test claim was processed through [facility]- copay amounts may vary at other pharmacies due to pharmacy/plan contracts, or as the patient moves through the different stages of their insurance plan.

## 2023-10-03 NOTE — Telephone Encounter (Signed)
 She seen Dr Russella Dar and he has retired and she is due for her colonoscopy now, she wants to know who at [facility] would you recommend?

## 2023-10-04 ENCOUNTER — Other Ambulatory Visit: Payer: Self-pay

## 2023-10-04 ENCOUNTER — Ambulatory Visit (INDEPENDENT_AMBULATORY_CARE_PROVIDER_SITE_OTHER): Payer: PPO | Admitting: Allergy and Immunology

## 2023-10-04 VITALS — BP 110/70 | HR 67 | Temp 98.2°F | Resp 20 | Ht 63.39 in | Wt 91.2 lb

## 2023-10-04 DIAGNOSIS — T63481D Toxic effect of venom of other arthropod, accidental (unintentional), subsequent encounter: Secondary | ICD-10-CM

## 2023-10-04 DIAGNOSIS — T63441D Toxic effect of venom of bees, accidental (unintentional), subsequent encounter: Secondary | ICD-10-CM

## 2023-10-04 MED ORDER — NEFFY 2 MG/0.1ML NA SOLN: 1.0000 | NASAL | 1 refills | Status: DC | PRN

## 2023-10-04 NOTE — Patient Instructions (Addendum)
  1.  Continue immunotherapy directed against mixed vespid and wasp  2.  Change epi-pen to NEFFY  3.  Return to clinic in 1 year or earlier if problem  4. Influenza = Tamiflu.  COVID = Paxlovid

## 2023-10-04 NOTE — Progress Notes (Unsigned)
 [place] - [place] - [place] - [place]   Follow-up Note  Referring Provider: Margaree Mackintosh, MD Primary Provider: Margaree Mackintosh, MD Date of Office Visit: 10/04/2023  Subjective:   Barbie Haggis, DDS (DOB: May 22, 1956) is a 68 y.o. 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  I last saw her in this clinic 19 October 2022.  She continues on immunotherapy currently every 12 weeks against mixed vespid and wasp and has been done very well and has not had any stings and continues to have a injectable epinephrine device.  Allergies as of 10/04/2023       Reactions   Bee Venom Anaphylaxis   MIXED VESPID, HONEY BEE        Medication List    ascorbic acid 500 MG tablet Commonly known as: VITAMIN C Take 1,000 mg by mouth daily.   Centrum Silver Adult 50+ Tabs Take 1 tablet by mouth daily.   denosumab 60 MG/ML Sosy injection Commonly known as: PROLIA Inject 60 mg into the skin every 6 (six) months.   EPIPEN 2-PAK IJ Inject as directed as needed.   ergocalciferol 1.25 MG (50000 UT) capsule Commonly known as: Drisdol Take 1 capsule (50,000 Units total) by mouth every 30 (thirty) days.   famotidine 20 MG tablet Commonly known as: PEPCID Take 20 mg by mouth as needed for heartburn or indigestion.   ibuprofen 200 MG tablet Commonly known as: ADVIL Take 400 mg by mouth every 6 (six) hours as needed for mild pain (pain score 1-3). Hs prn   magnesium oxide 400 MG tablet Commonly known as: MAG-OX Take 500 mg by mouth daily.   metroNIDAZOLE 0.75 % gel Commonly known as: METROGEL Apply 1 Application topically 2 (two) times daily.   milk thistle 175 MG tablet Take 175 mg by mouth daily.   NON FORMULARY Ca(Life Extension Bone Restore with Vit K2)  Take 1 capsule po 4 X daily.   Turmeric Curcumin 500 MG  Caps Take 500 mg by mouth daily.   Vitamin D 1000 units capsule Take 1,000 Units by mouth daily.    Past Medical History:  Diagnosis Date   Basal cell carcinoma 2008   Bulging of cervical intervertebral disc    Carpal tunnel syndrome on right    Finger numbness    Hymenoptera allergy    Osteoporosis    Postmenopausal    Raynaud's disease    self diagnosis    Past Surgical History:  Procedure Laterality Date   BIOPSY ENDOMETRIAL  2006   BREAST CYST ASPIRATION  12/02/1995    Review of systems negative except as noted in HPI / PMHx or noted below:  Review of Systems  Constitutional: Negative.   HENT: Negative.    Eyes: Negative.   Respiratory: Negative.    Cardiovascular: Negative.   Gastrointestinal: Negative.   Genitourinary: Negative.   Musculoskeletal: Negative.   Skin: Negative.   Neurological: Negative.   Endo/Heme/Allergies: Negative.   Psychiatric/Behavioral: Negative.       Objective:   Vitals:   10/04/23 1606  BP: 110/70  Pulse: 67  Resp: 20  Temp: 98.2 F (36.8 C)  SpO2: 99%   Height: 5' 3.39" (161 cm)  Weight: 91 lb 3.2 oz (41.4 kg)   Physical Exam Constitutional:      Appearance: She is not diaphoretic.  HENT:  Head: Normocephalic.     Right Ear: Tympanic membrane, ear canal and external ear normal.     Left Ear: Tympanic membrane, ear canal and external ear normal.     Nose: Nose normal. No mucosal edema or rhinorrhea.     Mouth/Throat:     Pharynx: Uvula midline. No oropharyngeal exudate.  Eyes:     Conjunctiva/sclera: Conjunctivae normal.  Neck:     Thyroid: No thyromegaly.     Trachea: Trachea normal. No tracheal tenderness or tracheal deviation.  Cardiovascular:     Rate and Rhythm: Normal rate and regular rhythm.     Heart sounds: Normal heart sounds, S1 normal and S2 normal. No murmur heard. Pulmonary:     Effort: No respiratory distress.     Breath sounds: Normal breath sounds. No stridor. No wheezing or rales.   Lymphadenopathy:     Head:     Right side of head: No tonsillar adenopathy.     Left side of head: No tonsillar adenopathy.     Cervical: No cervical adenopathy.  Skin:    Findings: No erythema or rash.     Nails: There is no clubbing.  Neurological:     Mental Status: She is alert.     Diagnostics: none  Assessment and Plan:   1. Toxic effect of venom of bees, unintentional, subsequent encounter     1.  Continue immunotherapy directed against mixed vespid and wasp  2.  Change epi-pen to NEFFY  3.  Return to clinic in 1 year or earlier if problem  4. Influenza = Tamiflu.  COVID = Paxlovid  Marisella appears to be doing quite well while utilizing immunotherapy directed against her hymenoptera venom hypersensitivity state and she will continue on this form of therapy and we will see her back in this clinic in 1 year or earlier if there is a problem.  We will change her from injectable epinephrine to intranasal epinephrine to be used if needed.  Laurette Schimke, MD Allergy / Immunology [facility]

## 2023-10-05 ENCOUNTER — Other Ambulatory Visit: Payer: Self-pay

## 2023-10-05 ENCOUNTER — Telehealth: Payer: Self-pay

## 2023-10-05 NOTE — Telephone Encounter (Signed)
*  Asthma/Allergy  Pharmacy Patient Advocate Encounter   Received notification from CoverMyMeds that prior authorization for Neffy 2MG /0.1ML solution  is required/requested.   Insurance verification completed.   The patient is insured through [facility] ADVANTAGE/RX ADVANCE .   Per test claim: PA required; PA submitted to above mentioned insurance via CoverMyMeds Key/confirmation #/EOC [id number] Status is pending

## 2023-10-05 NOTE — Progress Notes (Signed)
 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 female contacted today regarding initial fill of specialty medication(s) Denosumab (PROLIA)   Patient requested Courier to Provider Office   Delivery date: 10/10/23   Verified address: [facility]   Medication will be filled on 3/14.   Patient is aware of $250 copayment.

## 2023-10-07 ENCOUNTER — Other Ambulatory Visit: Payer: Self-pay

## 2023-10-07 MED ORDER — NEFFY 2 MG/0.1ML NA SOLN: 1.0000 | Freq: Once | NASAL | 4 refills | Status: AC | PRN

## 2023-10-07 NOTE — Addendum Note (Signed)
 Addended by: Areta Haber B on: 10/07/2023 01:17 PM   Modules accepted: Orders

## 2023-10-07 NOTE — Telephone Encounter (Signed)
 Pharmacy Patient Advocate Encounter  Received notification from [facility] ADVANTAGE/RX ADVANCE that Prior Authorization for Neffy has been DENIED.  Full denial letter will be uploaded to the media tab. See denial reason below.  Plan requirements for the approval of a non-formulary medication are: Diagnosis of a medically-accepted indication, AND; The patient has tried and failed at least one formulary alternative

## 2023-10-07 NOTE — Telephone Encounter (Signed)
 Resending Neffy prescription to [facility] w/ICD code in hopes of obtaining approval.

## 2023-10-09 IMAGING — MG MM DIGITAL SCREENING BILAT W/ TOMO AND CAD
8 series · 9 of 24 positions shown · non-contrast
Comparison: Previous exam(s).

CLINICAL DATA: Screening.

EXAM:
DIGITAL SCREENING BILATERAL MAMMOGRAM WITH TOMOSYNTHESIS AND CAD
TECHNIQUE: Bilateral screening digital craniocaudal and mediolateral oblique
mammograms were obtained. Bilateral screening digital breast
tomosynthesis was performed. The images were evaluated with
computer-aided detection.

[L CC synth-2D]
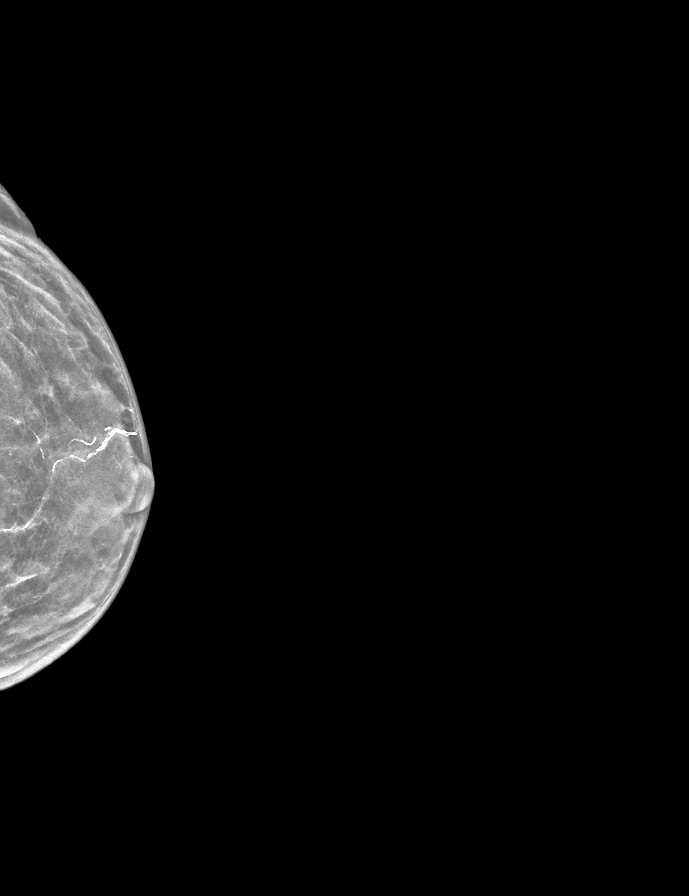

[L MLO synth-2D]
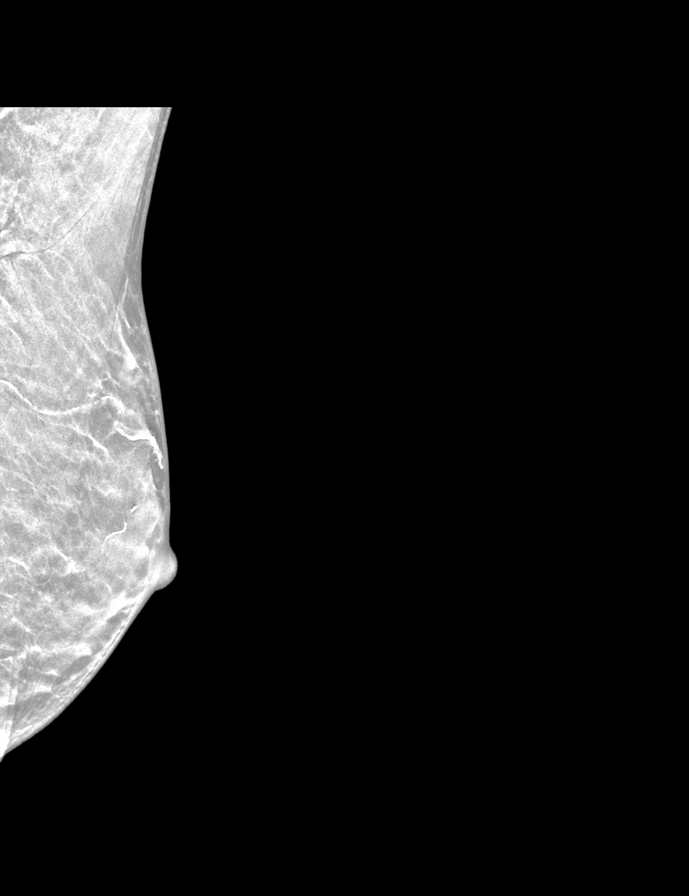

[R CC synth-2D]
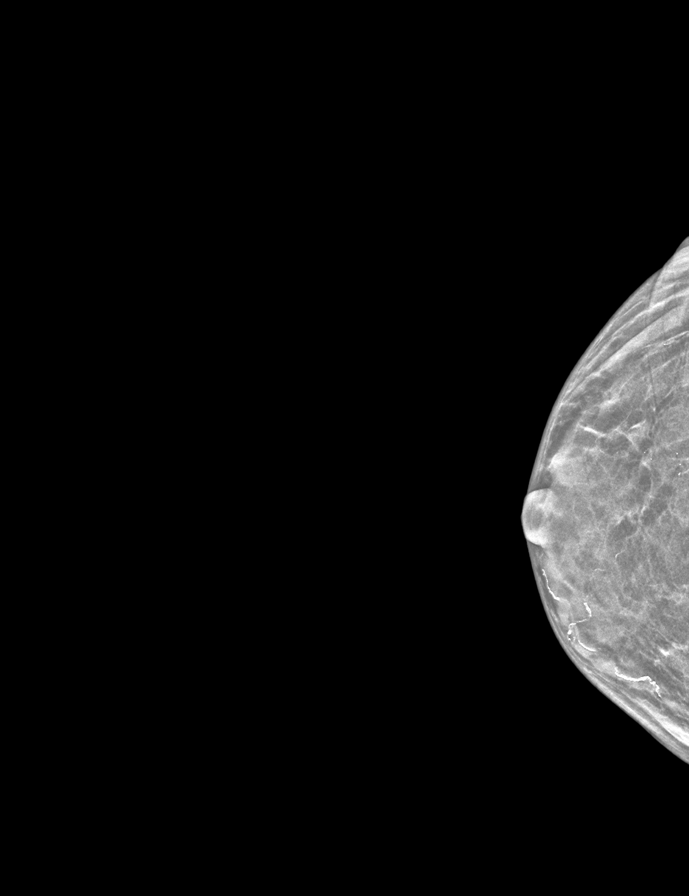

[R MLO synth-2D]
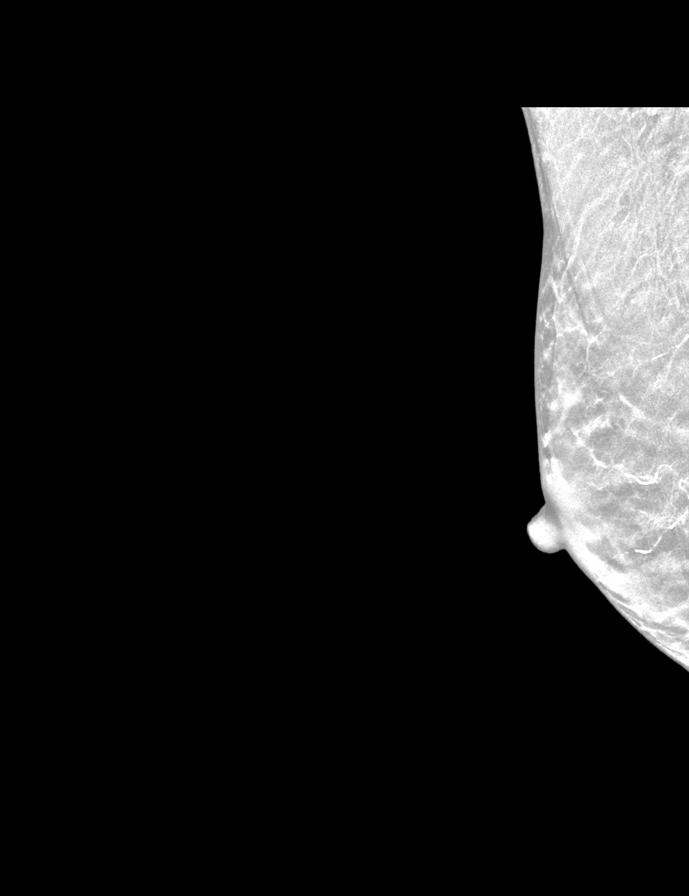

[R CC tomo · 2 of 28 frames shown]
[frame 10/28]
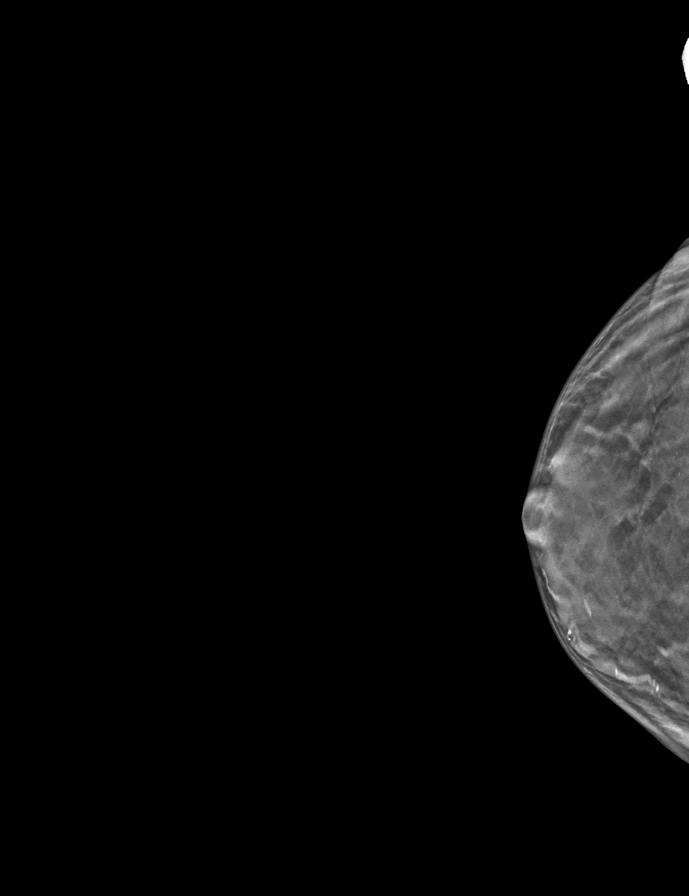
[frame 15/28]
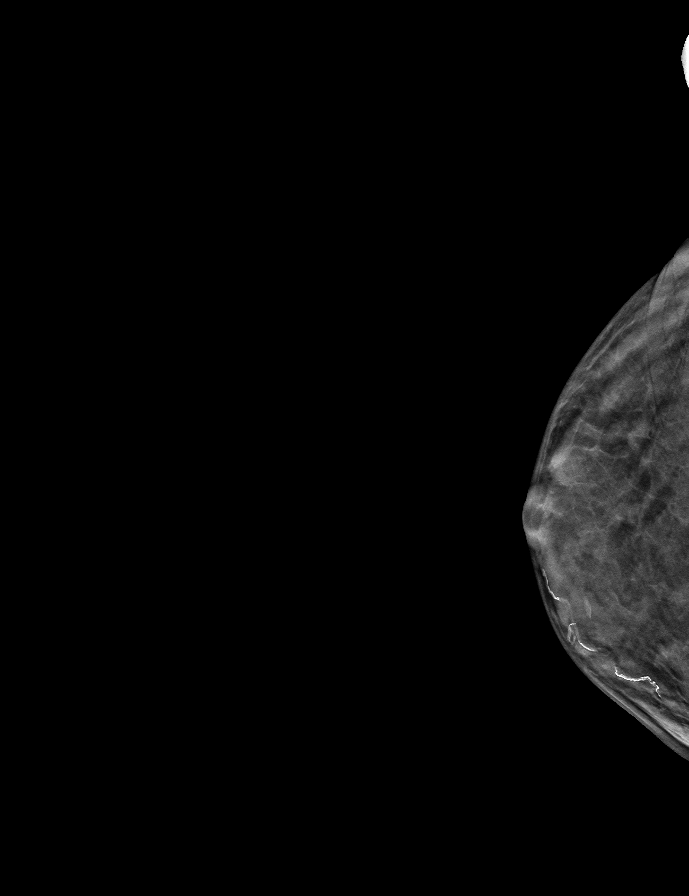

[L MLO tomo · tomo slice 15/29.0]
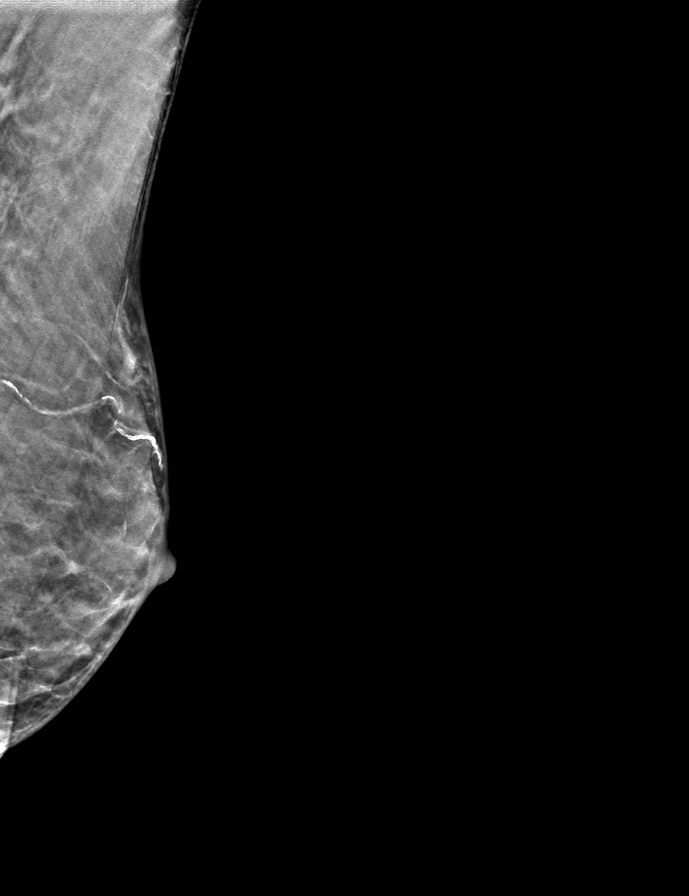

[L CC tomo · tomo slice 15/29.0]
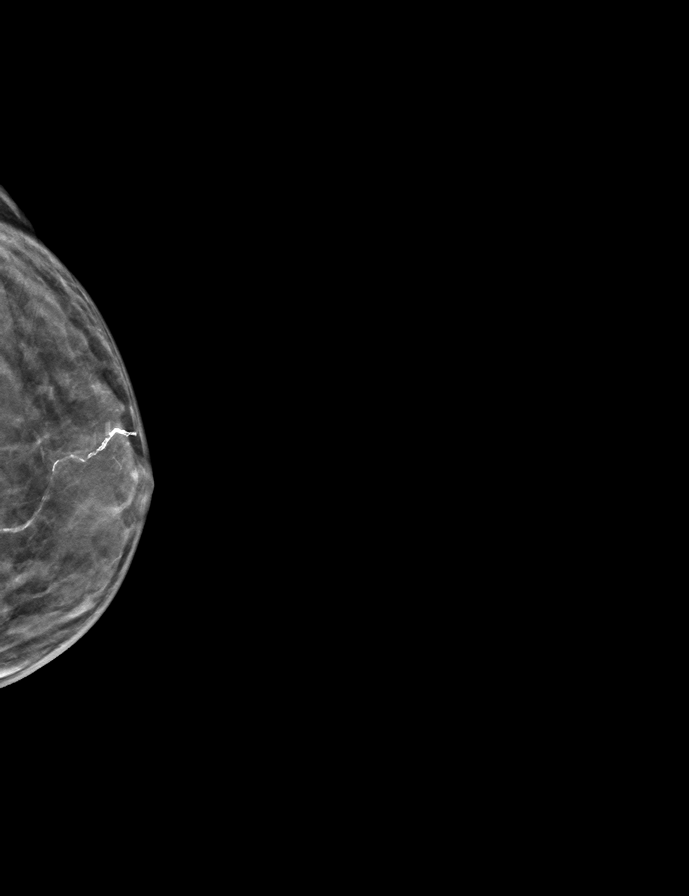

[R MLO tomo · tomo slice 14/27.0]
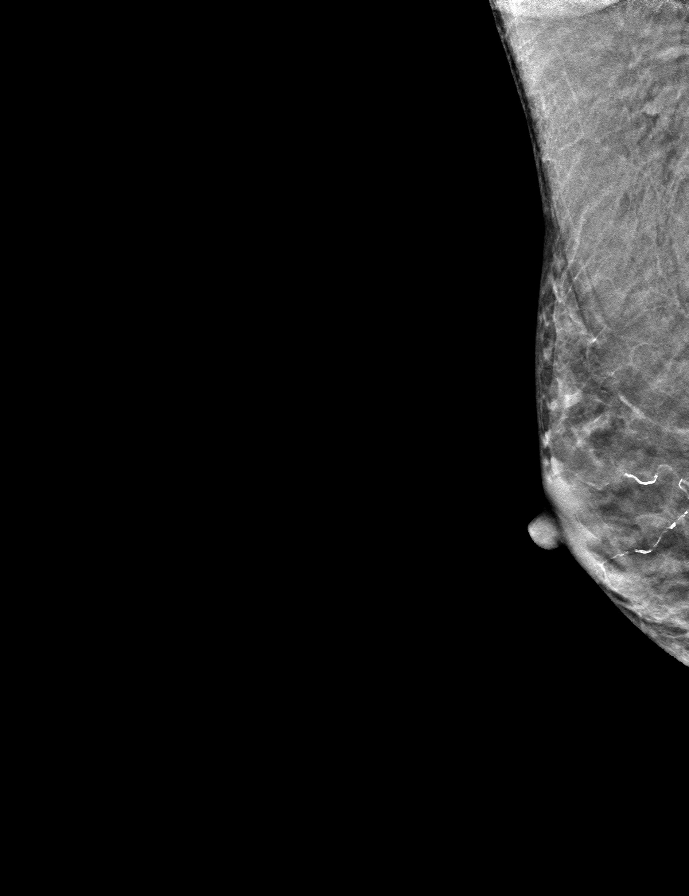

[9 of 24 positions shown; findings below may reference images not displayed]

ACR Breast Density Category d: The breast tissue is extremely dense,
which lowers the sensitivity of mammography
FINDINGS: There are no findings suspicious for malignancy.
IMPRESSION: No mammographic evidence of malignancy. A result letter of this
screening mammogram will be mailed directly to the patient.

RECOMMENDATION:
Screening mammogram in one year. (Code:TA-V-WV9)

BI-RADS CATEGORY  1: Negative.

## 2023-10-10 ENCOUNTER — Other Ambulatory Visit: Payer: Self-pay

## 2023-10-11 ENCOUNTER — Telehealth: Payer: Self-pay | Admitting: Internal Medicine

## 2023-10-11 NOTE — Telephone Encounter (Signed)
 Called patient to let her know her Prolia was in, to call back to schedule an clinical appointment for injection.

## 2023-10-13 NOTE — Telephone Encounter (Signed)
 Called patient to schedule her for her Prolia injection and she is having some stomach issues and said she would call back when she has gotten that straightened out to schedule.

## 2023-10-25 DIAGNOSIS — M542 Cervicalgia: Secondary | ICD-10-CM | POA: Diagnosis not present

## 2023-11-07 ENCOUNTER — Ambulatory Visit (HOSPITAL_BASED_OUTPATIENT_CLINIC_OR_DEPARTMENT_OTHER): Payer: PPO | Admitting: Obstetrics & Gynecology

## 2023-11-08 DIAGNOSIS — M542 Cervicalgia: Secondary | ICD-10-CM | POA: Diagnosis not present

## 2023-11-16 NOTE — Progress Notes (Unsigned)
 11/16/2023 [name], DDS [id number] 1956-03-26   Chief Complaint: Upper abdominal pain, discuss scheduling an upper endoscopy and colonoscopy  History of Present Illness: Tammy Shields is a 68 year old female with a past medical history of osteoporosis, chronic postprandial epigastric pain and bloating, constipation and hyperplastic colon polyps. Previously known by Dr. Sandrea Cruel.  She has chronic postprandial epigastric pain for several years and previously did not wish to pursue endoscopic evaluation.  She wishes to discuss scheduling an upper endoscopy at this time.  Due to her history of osteoporosis she has attempted to avoid acid reducing medications. She takes Famotidine 20 mg as needed.  She has infrequent heartburn.  No dysphagia.  No lower abdominal pain.  She typically passes a soft brown stool daily.  She infrequently sees a small amount of bright red blood on the toilet tissue which she contributes to having internal and external hemorrhoids and occurs once every few months.  No black stools.  Infrequent NSAID use.  RUQ sonogram 01/2023 was normal.  She underwent a colonoscopy 08/2013 which identified 2 small hyperplastic polyps and internal hemorrhoids.  No known family history of colorectal cancer.  She weighs 91 pounds and has stated this has been her weight for many years.     Latest Ref Rng & Units 07/28/2023    9:11 AM 10/29/2022   10:56 AM 07/09/2022    9:21 AM  CBC  WBC 3.8 - 10.8 Thousand/uL 2.6  2.5  3.4   Hemoglobin 11.7 - 15.5 g/dL 40.9  81.1  91.4   Hematocrit 35.0 - 45.0 % 41.8  41.3  38.5   Platelets 140 - 400 Thousand/uL 201  183  269        Latest Ref Rng & Units 07/28/2023    9:11 AM 05/13/2023   11:26 AM 07/09/2022    9:21 AM  CMP  Glucose 65 - 99 mg/dL 96  96  88   BUN 7 - 25 mg/dL 15  28  24    Creatinine 0.50 - 1.05 mg/dL 7.82  9.56  2.13   Sodium 135 - 146 mmol/L 138  142  142   Potassium 3.5 - 5.3 mmol/L 5.1  4.2  4.5   Chloride 98 - 110 mmol/L 101   104  104   CO2 20 - 32 mmol/L 30  30  30    Calcium 8.6 - 10.4 mg/dL 9.6  9.5  9.3   Total Protein 6.1 - 8.1 g/dL 6.7   6.3   Total Bilirubin 0.2 - 1.2 mg/dL 0.3   0.5   AST 10 - 35 U/L 22   24   ALT 6 - 29 U/L 16   17     RUQ sono 02/22/2023: FINDINGS: Gallbladder: No gallstones or wall thickening visualized. No sonographic Murphy sign noted by sonographer.   Common bile duct: Diameter: 2.3 mm   Liver: No focal lesion identified. Within normal limits in parenchymal echogenicity. Portal vein is patent on color Doppler imaging with normal direction of blood flow towards the liver.   IVC: No abnormality visualized.   Pancreas: Visualized portion unremarkable.   Spleen: Size and appearance within normal limits.   Right Kidney: Length: 9.3 cm. Echogenicity within normal limits. No mass or hydronephrosis visualized.   Left Kidney: Length: 10.8 cm. Echogenicity within normal limits. No mass or hydronephrosis visualized.   Abdominal aorta: No aneurysm visualized.   Other findings: None.   IMPRESSION: Normal abdomen ultrasound.  PAST GI PROCEDURES:   Colonoscopy 09/07/2013 by Dr. Sandrea Cruel: 4 mm hyperplastic polyp removed from the sigmoid: 5 mm hyperplastic polyp removed from the rectum Moderate internal hemorrhoids  Surgical [P], sigmoid, rectum, polyp (2) - HYPERPLASTIC POLYP(S). - POLYPOID FRAGMENTS OF BENIGN COLONIC MUCOSA WITH INTRAMUCOSAL LYMPHOID AGGREGATES. - NO ADENOMATOUS CHANGES OR MALIGNANCY.  Current Outpatient Medications on File Prior to Visit  Medication Sig Dispense Refill   Cholecalciferol (VITAMIN D ) 1000 UNITS capsule Take 1,000 Units by mouth daily.     EPINEPHrine  (NEFFY) 2 MG/0.1ML SOLN Place 1 spray into the nose as needed. 2 each 1   ergocalciferol  (DRISDOL ) 1.25 MG (50000 UT) capsule Take 1 capsule (50,000 Units total) by mouth every 30 (thirty) days. 3 capsule 3   famotidine (PEPCID) 20 MG tablet Take 20 mg by mouth as needed for heartburn or  indigestion.     ibuprofen (ADVIL,MOTRIN) 200 MG tablet Take 400 mg by mouth every 6 (six) hours as needed for mild pain (pain score 1-3). Hs prn     magnesium oxide (MAG-OX) 400 MG tablet Take 250 mg by mouth daily.     metroNIDAZOLE (METROGEL) 0.75 % gel Apply 1 Application topically 2 (two) times daily.     milk thistle 175 MG tablet Take 175 mg by mouth daily.     Multiple Vitamins-Minerals (CENTRUM SILVER ADULT 50+) TABS Take 1 tablet by mouth daily. Takes every other day     NEFFY 2 MG/0.1ML SOLN Place 1 spray into the nose once as needed for up to 1 dose. Use only in 1 nostril. 4 each 4   NON FORMULARY Ca(Life Extension Bone Restore with Vit K2)  Take 1 capsule po 4 X daily.     Turmeric Curcumin 500 MG CAPS Take 500 mg by mouth daily.     vitamin C (ASCORBIC ACID) 500 MG tablet Take 1,000 mg by mouth daily.      denosumab  (PROLIA ) 60 MG/ML SOSY injection Inject 60 mg into the skin every 6 (six) months. (Patient not taking: Reported on 11/17/2023) 1 mL 0   No current facility-administered medications on file prior to visit.   Allergies  Allergen Reactions   Bee Venom Anaphylaxis    MIXED VESPID, HONEY BEE   Current Medications, Allergies, Past Medical History, Past Surgical History, Family History and Social History were reviewed in [employer] record.  Review of Systems:   Constitutional: Negative for fever, sweats, chills or weight loss.  Respiratory: Negative for shortness of breath.   Cardiovascular: Negative for chest pain, palpitations and leg swelling.  Gastrointestinal: See HPI.  Musculoskeletal: Negative for back pain or muscle aches.  Neurological: Negative for dizziness, headaches or paresthesias.   Physical Exam: BP 108/60   Pulse 87   Ht 5\' 4"  (1.626 m)   Wt 91 lb 4 oz (41.4 kg)   BMI 15.66 kg/m   General: Thin 68 year old female in no acute distress. Head: Normocephalic and atraumatic. Eyes: No scleral icterus. Conjunctiva pink  . Ears: Normal auditory acuity. Mouth: Dentition intact. No ulcers or lesions.  Lungs: Clear throughout to auscultation. Heart: Regular rate and rhythm, no murmur. Abdomen: Soft. Very mild epigastric tenderness. Nondistended. Lack of abdominal adipose. No masses or hepatomegaly. Normal bowel sounds x 4 quadrants.  Rectal: Deferred.  Musculoskeletal: Symmetrical with no gross deformities. Extremities: No edema. Neurological: Alert oriented x 4. No focal deficits.  Psychological: Alert and cooperative. Normal mood and affect  Assessment and Recommendations:  68 year old  female with chronic postprandial epigastric pain. Normal RUQ sono 01/2023.  -EGD benefits and risks discussed including risk with sedation, risk of bleeding, perforation and infection  -Fdgard two tabs po bid  -Famotidine 20mg  po every day PRN -CTAP if EGD unrevealing   History of 2 hyperplastic colon polyps removed from the colon and rectum per colonoscopy 08/2023. No known family history of colon polyps or colorectal cancer.  -Colonoscopy benefits and risks discussed including risk with sedation, risk of bleeding, perforation and infection   Osteoporosis  -Continue follow up with endocrinology   Chronic leukopenia. WBC 2.6.  -Follow up with PCP, consider hematology referral

## 2023-11-17 ENCOUNTER — Encounter: Payer: Self-pay | Admitting: Nurse Practitioner

## 2023-11-17 ENCOUNTER — Ambulatory Visit: Admitting: Nurse Practitioner

## 2023-11-17 VITALS — BP 108/60 | HR 87 | Ht 64.0 in | Wt 91.2 lb

## 2023-11-17 DIAGNOSIS — Z860102 Personal history of hyperplastic colon polyps: Secondary | ICD-10-CM | POA: Diagnosis not present

## 2023-11-17 DIAGNOSIS — Z1211 Encounter for screening for malignant neoplasm of colon: Secondary | ICD-10-CM

## 2023-11-17 DIAGNOSIS — G8929 Other chronic pain: Secondary | ICD-10-CM | POA: Diagnosis not present

## 2023-11-17 DIAGNOSIS — R1013 Epigastric pain: Secondary | ICD-10-CM | POA: Diagnosis not present

## 2023-11-17 MED ORDER — NA SULFATE-K SULFATE-MG SULF 17.5-3.13-1.6 GM/177ML PO SOLN
ORAL | 0 refills | Status: AC
Start: 2023-11-17 — End: ?

## 2023-11-17 NOTE — Progress Notes (Signed)
 Agree with assessment and plan as outlined.

## 2023-11-17 NOTE — Patient Instructions (Signed)
 You have been scheduled for an endoscopy and colonoscopy. Please follow the written instructions given to you at your visit today.  If you use inhalers (even only as needed), please bring them with you on the day of your procedure.  DO NOT TAKE 7 DAYS PRIOR TO TEST- Trulicity (dulaglutide) Ozempic, Wegovy (semaglutide) Mounjaro (tirzepatide) Bydureon Bcise (exanatide extended release)  DO NOT TAKE 1 DAY PRIOR TO YOUR TEST Rybelsus (semaglutide) Adlyxin (lixisenatide) Victoza (liraglutide) Byetta (exanatide) ___________________________________________________________________________  FDGuard- take 2 by mouth twice daily for abdominla pain.  Due to recent changes in healthcare laws, you may see the results of your imaging and laboratory studies on MyChart before your provider has had a chance to review them.  We understand that in some cases there may be results that are confusing or concerning to you. Not all laboratory results come back in the same time frame and the provider may be waiting for multiple results in order to interpret others.  Please give us  48 hours in order for your provider to thoroughly review all the results before contacting the office for clarification of your results.   Thank you for trusting me with your gastrointestinal care!   Everett Hitt, CRNP

## 2023-11-24 ENCOUNTER — Ambulatory Visit: Payer: PPO | Admitting: Internal Medicine

## 2023-11-24 ENCOUNTER — Encounter: Payer: Self-pay | Admitting: Internal Medicine

## 2023-11-24 VITALS — BP 120/80 | HR 84 | Ht 64.0 in | Wt 91.8 lb

## 2023-11-24 DIAGNOSIS — M810 Age-related osteoporosis without current pathological fracture: Secondary | ICD-10-CM | POA: Diagnosis not present

## 2023-11-24 LAB — VITAMIN D 25 HYDROXY (VIT D DEFICIENCY, FRACTURES): Vit D, 25-Hydroxy: 51 ng/mL (ref 30–100)

## 2023-11-24 NOTE — Progress Notes (Unsigned)
 Name: Tammy Shields, DDS  MRN/ DOB: [id number], [birth year]    Age/ Sex: 68 y.o., female    PCP: Sylvan Evener, MD   Reason for Endocrinology Evaluation: Osteoporosis     Date of Initial Endocrinology Evaluation: 11/24/2023     HPI: Ms. Tammy Shields, DDS is a 68 y.o. female with a past medical history of osteoporosis, Raynaud's disease. The patient presented for initial endocrinology clinic visit on 11/24/2023 for consultative assistance with her osteoporosis.   Pt was diagnosed with osteoporosis: In 2012 with a T-score of -3.2 at the AP spine  Menarche at age : 1 Menopausal at age : late 66;s  Fracture Hx: no Hx of HRT: no  FH of osteoporosis or hip fracture: sister with Osteopenia   Prior Hx of anti-resorptive therapy :  was on Fosamax for 2  years, which bothered her stomach , which was > 20 yrs ago.     She is a retired Education officer, community  She does body pump exercise as a form of weight bearing exercise.   She is under the care of GI for epigastric pain  No glucocorticoids  No celiac disease dx  Pt not keen on medications   No prior CVA or CAD    On her initial visit she had a normal PTH, TFTs, vitamin D , magnesium, and phosphorus as well as normal GFR   24-hour urinary calcium was elevated 306 MG, 04/2023  She did start meeting with a dietitian  Repeat 24-hour urinary calcium normal at 210 mg 05/2023   SUBJECTIVE:    Today (11/24/23):  [name] is here for follow-up on osteoporosis.  Her PCP proceeded with Prolia  authorization  She is concerned about Prolia  due to chronic abdominal pains, schedule for EGD and colonoscopy  Denies nausea  Denies constipation or diarrhea  Denies recent fall  She does continues with weight bearing exercise  She has been consuming 800 mg of sodium daily   MVI  Bone restore with Vitamin K , 4 caps = 1 serving = Calcium Carbonate 700 mg , Ergocalciferol  50,000 units monthly   HISTORY:  Past Medical History:  Past Medical  History:  Diagnosis Date   Basal cell carcinoma 2008   Bulging of cervical intervertebral disc    Carpal tunnel syndrome on right    Finger numbness    Hymenoptera allergy    Osteoporosis    Postmenopausal    Raynaud's disease    self diagnosis   Past Surgical History:  Past Surgical History:  Procedure Laterality Date   BIOPSY ENDOMETRIAL  2006   BREAST CYST ASPIRATION  12/02/1995    Social History:  reports that she has never smoked. She has never used smokeless tobacco. She reports current alcohol use of about 8.0 - 10.0 standard drinks of alcohol per week. She reports that she does not use drugs. Family History: family history includes Diabetes in her brother and mother; Heart disease in her mother; Hypertension in her mother and sister; Lung cancer in her mother; Prostate cancer in her father.   HOME MEDICATIONS: Allergies as of 11/24/2023       Reactions   Bee Venom Anaphylaxis   MIXED VESPID, HONEY BEE        Medication List        Accurate as of Nov 24, 2023  8:31 AM. If you have any questions, ask your nurse or doctor.          ascorbic acid 500 MG  tablet Commonly known as: VITAMIN C Take 1,000 mg by mouth daily.   Centrum Silver Adult 50+ Tabs Take 1 tablet by mouth daily. Takes every other day   ergocalciferol  1.25 MG (50000 UT) capsule Commonly known as: Drisdol  Take 1 capsule (50,000 Units total) by mouth every 30 (thirty) days.   famotidine 20 MG tablet Commonly known as: PEPCID Take 20 mg by mouth as needed for heartburn or indigestion.   ibuprofen 200 MG tablet Commonly known as: ADVIL Take 400 mg by mouth every 6 (six) hours as needed for mild pain (pain score 1-3). Hs prn   magnesium oxide 400 MG tablet Commonly known as: MAG-OX Take 250 mg by mouth daily.   metroNIDAZOLE 0.75 % gel Commonly known as: METROGEL Apply 1 Application topically 2 (two) times daily.   milk thistle 175 MG tablet Take 175 mg by mouth daily.   Na Sulfate-K  Sulfate-Mg Sulfate concentrate 17.5-3.13-1.6 GM/177ML Soln Commonly known as: SUPREP Use as directed; may use generic; goodrx card if insurance will not cover generic   Neffy 2 MG/0.1ML Soln Generic drug: EPINEPHrine  Place 1 spray into the nose as needed.   Neffy 2 MG/0.1ML Soln Generic drug: EPINEPHrine  Place 1 spray into the nose once as needed for up to 1 dose. Use only in 1 nostril.   NON FORMULARY Ca(Life Extension Bone Restore with Vit K2)  Take 1 capsule po 4 X daily.   Prolia  60 MG/ML Sosy injection Generic drug: denosumab  Inject 60 mg into the skin every 6 (six) months.   Turmeric Curcumin 500 MG Caps Take 500 mg by mouth daily.   Vitamin D  1000 units capsule Take 1,000 Units by mouth daily.          REVIEW OF SYSTEMS: A comprehensive ROS was conducted with the patient and is negative except as per HPI     OBJECTIVE:  VS: BP 120/80 (BP Location: Left Arm, Patient Position: Sitting, Cuff Size: Normal)   Pulse 84   Ht 5\' 4"  (1.626 m)   Wt 91 lb 12.8 oz (41.6 kg)   SpO2 99%   BMI 15.76 kg/m    Wt Readings from Last 3 Encounters:  11/24/23 91 lb 12.8 oz (41.6 kg)  11/17/23 91 lb 4 oz (41.4 kg)  10/04/23 91 lb 3.2 oz (41.4 kg)     EXAM: General: Pt appears well and is in NAD  Neck: General: Supple without adenopathy. Thyroid : Thyroid  size normal.  No goiter or nodules appreciated.   Lungs: Clear with good BS bilat   Heart: Auscultation: RRR.  Abdomen: Soft, nontender  Extremities:  BL LE: No pretibial edema   Mental Status: Judgment, insight: Intact Orientation: Oriented to time, place, and person Mood and affect: No depression, anxiety, or agitation     DATA REVIEWED:   Latest Reference Range & Units 11/24/23 09:02  Vitamin D , 25-Hydroxy 30 - 100 ng/mL 51      Latest Reference Range & Units 05/13/23 11:26  Sodium 135 - 145 mEq/L 142  Potassium 3.5 - 5.1 mEq/L 4.2  Chloride 96 - 112 mEq/L 104  CO2 19 - 32 mEq/L 30  Glucose 70 - 99  mg/dL 96  BUN 6 - 23 mg/dL 28 (H)  Creatinine 1.61 - 1.20 mg/dL 0.96  Calcium 8.4 - 04.5 mg/dL 9.5  Phosphorus 2.3 - 4.6 mg/dL 3.8  Magnesium 1.5 - 2.5 mg/dL 2.0  Albumin 3.5 - 5.2 g/dL 4.5  GFR >40.98 mL/min 91.94     Latest Reference  Range & Units 05/13/23 11:26  VITD 30.00 - 100.00 ng/mL 36.92    Latest Reference Range & Units 05/13/23 11:26  PTH, Intact 16 - 77 pg/mL 49  TSH 0.35 - 5.50 uIU/mL 0.58  T4,Free(Direct) 0.60 - 1.60 ng/dL 2.95      DXA 09/02/4130 ASSESSMENT: The BMD measured at [facility] Sp[facility] L1-L4 is 0.735 g/cm2 with a T-score of -3.7. This patient is considered osteoporotic according to World Health Organization [facility]) criteria.   The quality of the exam is good.   Site Region Measured Date Measured Age YA BMD Significant CHANGE T-score AP Spine  L1-L4      11/24/2021    67.9         -3.7    0.735 g/cm2 AP Spine  L1-L4      09/25/2019    65.8         -3.6    0.753 g/cm2   DualFemur Neck Left  11/24/2021    67.9         -2.7    0.657 g/cm2 DualFemur Neck Left  09/25/2019    65.8         -2.8    0.652 g/cm2   DualFemur Total Mean 11/24/2021    67.9         -2.4    0.710 g/cm2 DualFemur Total Mean 09/25/2019    65.8         -2.4    0.706 g/cm2      ASSESSMENT/PLAN/RECOMMENDATIONS:   Osteoporosis :  -Patient with severe osteoporosis -She is at high risk for bone fractures, which she understands -She was on alendronate for a couple years many decades ago, she did develop GI side effects. - Historically she has been very skeptical regarding antiresorptive therapy.  Recently her PCP went through by authorization for Prolia .  The patient continues to be concerned and skeptical about side effects, but she also tells me about her neighbor who broke ribs and she is concerned about fractures -I did explain to the patient she is going to have to take time to think about her options, but other than me trying to reassure her that Prolia  is a great medication that has been  on the market for 15 years with very minimal side effects, I am not sure how else I could convince her to consider antiresorptive therapy -I did explain to the patient that Prolia  is usually given for 5 years, followed by either 1 dose of Reclast infusion or 1 year of alendronate - An order for DXA scan was placed, patient will contact the breast center to reschedule  2.  Hypercalciuria:  - This has normalized with dietary changes   I spent 50 minutes preparing to see the patient by review of recent labs, imaging and procedures, obtaining and reviewing separately obtained history, communicating with the patient/family or caregiver, ordering medications, tests or procedures, and documenting clinical information in the EHR including the differential Dx, treatment, and any further evaluation and other management    Signed electronically by: Natale Bail, MD  [facility] Endocrinology  [facility] [address] [place] [place], [place] [address] Phone: [phone] FAX: [phone]   CC: Sylvan Evener, MD [address] [place] [address] Phone: [phone] Fax: [phone]   Return to Endocrinology clinic as below: Future Appointments  Date Time Provider Department Center  11/24/2023  8:50 AM Heatherly Stenner, Julian Obey, MD [facility] None  12/12/2023 11:00 AM [facility] NURSE [facility] None  01/02/2024  2:55 PM Lillian Rein, MD [facility]  01/10/2024 12:30 PM Armbruster, Lendon Queen, MD [facility]  07/30/2024  9:10 AM [facility] MJB  07/31/2024 10:00 AM Sylvan Evener, MD [facility] MJB  10/02/2024  4:00 PM Kozlow, Rema Care, MD [facility] None

## 2023-11-28 ENCOUNTER — Telehealth: Payer: Self-pay | Admitting: Internal Medicine

## 2023-11-28 ENCOUNTER — Encounter: Payer: Self-pay | Admitting: Internal Medicine

## 2023-11-28 NOTE — Telephone Encounter (Signed)
 Patient is calling to say that she called to schedule her own bone density and they gave her an appointment for January 2026.  Patient is calling to ask if our office could get her in sooner.

## 2023-11-29 DIAGNOSIS — M542 Cervicalgia: Secondary | ICD-10-CM | POA: Diagnosis not present

## 2023-12-12 ENCOUNTER — Ambulatory Visit: Payer: PPO

## 2023-12-12 DIAGNOSIS — T782XXD Anaphylactic shock, unspecified, subsequent encounter: Secondary | ICD-10-CM

## 2023-12-12 DIAGNOSIS — T63481D Toxic effect of venom of other arthropod, accidental (unintentional), subsequent encounter: Secondary | ICD-10-CM | POA: Diagnosis not present

## 2023-12-13 DIAGNOSIS — M542 Cervicalgia: Secondary | ICD-10-CM | POA: Diagnosis not present

## 2023-12-20 ENCOUNTER — Telehealth: Payer: Self-pay | Admitting: Nurse Practitioner

## 2023-12-20 NOTE — Telephone Encounter (Signed)
 PT is scheduled for an EGD/colon on 6/9 and tested covid+.  She would like to know if she will need to reschedule this procedure. Please advise.

## 2023-12-20 NOTE — Telephone Encounter (Signed)
 Patient states that she tested positive for COVID on Saturday, 12/17/23 but was likely positive several days before testing. She states that she is now starting to feel better and better. Patient is advised that as long as she is symptom free by the time of 6/9 procedure, she should be ok to proceed with the test. However, if she continues with symptoms within a couple days of the procedure, she should reschedule. Patient verbalizes understanding.

## 2023-12-27 DIAGNOSIS — H2513 Age-related nuclear cataract, bilateral: Secondary | ICD-10-CM | POA: Diagnosis not present

## 2023-12-27 DIAGNOSIS — H52203 Unspecified astigmatism, bilateral: Secondary | ICD-10-CM | POA: Diagnosis not present

## 2023-12-27 DIAGNOSIS — M542 Cervicalgia: Secondary | ICD-10-CM | POA: Diagnosis not present

## 2023-12-27 DIAGNOSIS — H43813 Vitreous degeneration, bilateral: Secondary | ICD-10-CM | POA: Diagnosis not present

## 2024-01-02 ENCOUNTER — Ambulatory Visit (HOSPITAL_BASED_OUTPATIENT_CLINIC_OR_DEPARTMENT_OTHER): Payer: PPO | Admitting: Obstetrics & Gynecology

## 2024-01-02 ENCOUNTER — Encounter: Payer: Self-pay | Admitting: Gastroenterology

## 2024-01-02 ENCOUNTER — Other Ambulatory Visit (HOSPITAL_COMMUNITY)
Admission: RE | Admit: 2024-01-02 | Discharge: 2024-01-02 | Disposition: A | Discharge status: Home / Self Care | Source: Ambulatory Visit | Attending: Obstetrics & Gynecology | Admitting: Obstetrics & Gynecology

## 2024-01-02 ENCOUNTER — Encounter (HOSPITAL_BASED_OUTPATIENT_CLINIC_OR_DEPARTMENT_OTHER): Payer: Self-pay | Admitting: Obstetrics & Gynecology

## 2024-01-02 VITALS — BP 124/82 | HR 72 | Ht 64.0 in | Wt 91.0 lb

## 2024-01-02 DIAGNOSIS — D709 Neutropenia, unspecified: Secondary | ICD-10-CM

## 2024-01-02 DIAGNOSIS — M810 Age-related osteoporosis without current pathological fracture: Secondary | ICD-10-CM | POA: Diagnosis not present

## 2024-01-02 DIAGNOSIS — Z124 Encounter for screening for malignant neoplasm of cervix: Secondary | ICD-10-CM

## 2024-01-02 DIAGNOSIS — Z01419 Encounter for gynecological examination (general) (routine) without abnormal findings: Secondary | ICD-10-CM

## 2024-01-02 DIAGNOSIS — Z1231 Encounter for screening mammogram for malignant neoplasm of breast: Secondary | ICD-10-CM

## 2024-01-02 NOTE — Patient Instructions (Signed)
Call [phone] to schedule an appointment at [facility].

## 2024-01-02 NOTE — Progress Notes (Signed)
 Breast and Pelvic exam Patient name: Quincey, Nored MRN [id number]  Date of birth: 11/21/1955 Chief Complaint:   Breast and Pelvic Exam  History of Present Illness:   KOA PALLA, DDS is a 68 y.o. G0P0000 Caucasian female being seen today for breast and pelvic exam.  PMP and not on HRT.   She has osteoporosis.  Has seen Dr. Rosalea Collin.  She has seen nutritionist as well.  Seeing Dr. Gerry Krone on 6/17, she is having upper GI and colonoscopy.  Still contemplating what to do with her bone health.  Is scheduled for bone density in January at [facility] but I do not think these are currently being done at the Breast Center right now.  She would like to do this sooner.  Will order for [facility] radiology today.    Denies vaginal bleeding.    No LMP recorded. Patient is postmenopausal.   Last pap 05/29/2021 . Results were: negative. Last mammogram: 09/29/2021 . Results were: normal.  Last colonoscopy: 09/07/2013.  Scheduled 01/10/2024.   Dexa:   order placed.       01/02/2024    3:03 PM 07/29/2023   11:04 AM 10/26/2022    3:43 PM 07/14/2022   10:03 AM 06/24/2022    4:54 PM  Depression screen PHQ 2/9  Decreased Interest 0 0 0 0 0  Down, Depressed, Hopeless 0 0 0 0 0  PHQ - 2 Score 0 0 0 0 0     Review of Systems:   Pertinent items are noted in HPI  Denies any vaginal bleeding, pelvic pain, or urinary changes. Pertinent History Reviewed:  Reviewed past medical,surgical, social and family history.  Reviewed problem list, medications and allergies. Physical Assessment:   Vitals:   01/02/24 1452  BP: 124/82  Pulse: 72  Weight: 91 lb (41.3 kg)  Height: 5\' 4"  (1.626 m)  Body mass index is 15.62 kg/m.        Physical Examination:   General appearance - well appearing, and in no distress  Mental status - alert, oriented to person, place, and time  Psych:  She has a normal mood and affect  Skin - warm and dry, normal color, no suspicious lesions noted  Chest - effort  normal, all lung fields clear to auscultation bilaterally  Heart - normal rate and regular rhythm  Neck:  midline trachea, no thyromegaly or nodules  Breasts - breasts appear normal, no suspicious masses, no skin or nipple changes or  axillary nodes  Abdomen - soft, nontender, nondistended, no masses or organomegaly  Pelvic - VULVA: normal appearing vulva with no masses, tenderness or lesions   VAGINA: normal appearing vagina with normal color and discharge, no lesions   CERVIX: normal appearing cervix without discharge or lesions, no CMT  Thin prep pap is updated today.    UTERUS: uterus is felt to be normal size, shape, consistency and nontender   ADNEXA: No adnexal masses or tenderness noted.  Rectal - normal rectal, good sphincter tone, no masses felt.   Extremities:  No swelling or varicosities noted  Chaperone present for exam   Assessment & Plan:  1. Encntr for gyn exam (general) (routine) w/o abn findings (Primary) - Pap smear obtained today - Mammogram ordered - Colonoscopy scheduled next week - Bone mineral density ordered - lab work done with PCP, Dr. Liane Redman - vaccines reviewed/updated  2. Age-related osteoporosis without current pathological fracture - DG BONE DENSITY (DXA); Future  3. Cervical cancer screening - Cytology -  PAP( [place]) - PR OBTAINING SCREEN PAP SMEAR  4. Encounter for screening mammogram for malignant neoplasm of breast - MM 3D SCREENING MAMMOGRAM BILATERAL BREAST; Future  5. Neutropenia, unspecified type (HCC) - followed by Dr. Liane Redman and has seen Dr. Dirk Fredericks, hematology/oncology   Orders Placed This Encounter  Procedures   DG BONE DENSITY (DXA)   MM 3D SCREENING MAMMOGRAM BILATERAL BREAST   PR OBTAINING SCREEN PAP SMEAR    Meds: No orders of the defined types were placed in this encounter.   Follow-up: No follow-ups on file.  Lillian Rein, MD 01/02/2024 3:44 PM

## 2024-01-03 LAB — CYTOLOGY - PAP: Diagnosis: NEGATIVE

## 2024-01-04 ENCOUNTER — Ambulatory Visit (HOSPITAL_BASED_OUTPATIENT_CLINIC_OR_DEPARTMENT_OTHER): Payer: Self-pay | Admitting: Obstetrics & Gynecology

## 2024-01-04 NOTE — Addendum Note (Signed)
 Addended by: Marliss Simple on: 01/04/2024 03:41 PM   Modules accepted: Orders

## 2024-01-10 ENCOUNTER — Ambulatory Visit: Admitting: Gastroenterology

## 2024-01-10 ENCOUNTER — Encounter: Payer: Self-pay | Admitting: Gastroenterology

## 2024-01-10 VITALS — BP 119/66 | HR 48 | Temp 97.2°F | Resp 16 | Ht 64.0 in | Wt 91.0 lb

## 2024-01-10 DIAGNOSIS — K648 Other hemorrhoids: Secondary | ICD-10-CM

## 2024-01-10 DIAGNOSIS — K6389 Other specified diseases of intestine: Secondary | ICD-10-CM | POA: Diagnosis not present

## 2024-01-10 DIAGNOSIS — K449 Diaphragmatic hernia without obstruction or gangrene: Secondary | ICD-10-CM | POA: Diagnosis not present

## 2024-01-10 DIAGNOSIS — K319 Disease of stomach and duodenum, unspecified: Secondary | ICD-10-CM | POA: Diagnosis not present

## 2024-01-10 DIAGNOSIS — K2100 Gastro-esophageal reflux disease with esophagitis, without bleeding: Secondary | ICD-10-CM

## 2024-01-10 DIAGNOSIS — D123 Benign neoplasm of transverse colon: Secondary | ICD-10-CM | POA: Diagnosis not present

## 2024-01-10 DIAGNOSIS — K317 Polyp of stomach and duodenum: Secondary | ICD-10-CM

## 2024-01-10 DIAGNOSIS — K5730 Diverticulosis of large intestine without perforation or abscess without bleeding: Secondary | ICD-10-CM | POA: Diagnosis not present

## 2024-01-10 DIAGNOSIS — R1013 Epigastric pain: Secondary | ICD-10-CM

## 2024-01-10 DIAGNOSIS — Q399 Congenital malformation of esophagus, unspecified: Secondary | ICD-10-CM | POA: Diagnosis not present

## 2024-01-10 DIAGNOSIS — Z1211 Encounter for screening for malignant neoplasm of colon: Secondary | ICD-10-CM

## 2024-01-10 DIAGNOSIS — K635 Polyp of colon: Secondary | ICD-10-CM | POA: Diagnosis not present

## 2024-01-10 MED ORDER — SODIUM CHLORIDE 0.9 % IV SOLN: 500.0000 mL | Freq: Once | INTRAVENOUS | Status: DC

## 2024-01-10 NOTE — Progress Notes (Signed)
 [facility] History and Physical   Primary Care Physician:  Sylvan Evener, MD   Reason for Procedure:   Epigastric pain, colon cancer screening  Plan:    EGD and colonoscopy     HPI: Tammy Shields, Tammy Shields is a 68 y.o. female  here for EGD and colonoscopy. Having postprandial pain, started on pepcid. Normal RUQ US . Last colonoscopy 2015 normal.   Pepcid has helped but she does not like taking it. HAs osteoporosis concerned about taking antacids.Otherwise feels well without any cardiopulmonary symptoms.   I have discussed risks / benefits of anesthesia and endoscopic procedure with [name], Tammy Shields and they wish to proceed with the exams as outlined today.    Past Medical History:  Diagnosis Date   Basal cell carcinoma 2008   Bulging of cervical intervertebral disc    Carpal tunnel syndrome on right    Finger numbness    Hymenoptera allergy    Osteoporosis    Postmenopausal    Raynaud's disease    self diagnosis    Past Surgical History:  Procedure Laterality Date   BIOPSY ENDOMETRIAL  2006   BREAST CYST ASPIRATION  12/02/1995    Prior to Admission medications   Medication Sig Start Date End Date Taking? Authorizing Provider  Cholecalciferol (VITAMIN D ) 1000 UNITS capsule Take 1,000 Units by mouth daily.   Yes [provider]  ergocalciferol  (DRISDOL ) 1.25 MG (50000 UT) capsule Take 1 capsule (50,000 Units total) by mouth every 30 (thirty) days. 07/29/23  Yes Baxley, Jaynie Meyers, MD  magnesium oxide (MAG-OX) 400 MG tablet Take 250 mg by mouth daily.   Yes [provider]  metroNIDAZOLE (METROGEL) 0.75 % gel Apply 1 Application topically 2 (two) times daily.   Yes [provider]  Multiple Vitamins-Minerals (CENTRUM SILVER ADULT 50+) TABS Take 1 tablet by mouth daily. Takes every other day   Yes [provider]  NON FORMULARY Ca(Life Extension Bone Restore with Vit K2)  Take 1 capsule po 4 X daily.   Yes [provider]   vitamin C (ASCORBIC ACID) 500 MG tablet Take 1,000 mg by mouth daily.    Yes [provider]  famotidine (PEPCID) 20 MG tablet Take 20 mg by mouth as needed for heartburn or indigestion.    [provider]  ibuprofen (ADVIL,MOTRIN) 200 MG tablet Take 400 mg by mouth every 6 (six) hours as needed for mild pain (pain score 1-3). Hs prn    [provider]  NEFFY  2 MG/0.1ML SOLN Place 1 spray into the nose once as needed for up to 1 dose. Use only in 1 nostril. 10/07/23   Kozlow, Rema Care, MD    Current Outpatient Medications  Medication Sig Dispense Refill   Cholecalciferol (VITAMIN D ) 1000 UNITS capsule Take 1,000 Units by mouth daily.     ergocalciferol  (DRISDOL ) 1.25 MG (50000 UT) capsule Take 1 capsule (50,000 Units total) by mouth every 30 (thirty) days. 3 capsule 3   magnesium oxide (MAG-OX) 400 MG tablet Take 250 mg by mouth daily.     metroNIDAZOLE (METROGEL) 0.75 % gel Apply 1 Application topically 2 (two) times daily.     Multiple Vitamins-Minerals (CENTRUM SILVER ADULT 50+) TABS Take 1 tablet by mouth daily. Takes every other day     NON FORMULARY Ca(Life Extension Bone Restore with Vit K2)  Take 1 capsule po 4 X daily.     vitamin C (ASCORBIC ACID) 500 MG tablet Take 1,000 mg by mouth  daily.      famotidine (PEPCID) 20 MG tablet Take 20 mg by mouth as needed for heartburn or indigestion.     ibuprofen (ADVIL,MOTRIN) 200 MG tablet Take 400 mg by mouth every 6 (six) hours as needed for mild pain (pain score 1-3). Hs prn     NEFFY  2 MG/0.1ML SOLN Place 1 spray into the nose once as needed for up to 1 dose. Use only in 1 nostril. 4 each 4   Current Facility-Administered Medications  Medication Dose Route Frequency Provider Last Rate Last Admin   0.9 %  sodium chloride  infusion  500 mL Intravenous Once Georgi Tuel, Lendon Queen, MD        Allergies as of 01/10/2024 - Review Complete 01/10/2024  Allergen Reaction Noted   Bee venom Anaphylaxis 01/05/2011    Family  History  Problem Relation Age of Onset   Heart disease Mother    Diabetes Mother    Lung cancer Mother    Hypertension Mother    Prostate cancer Father    Hypertension Sister    Diabetes Brother    Colon cancer Neg Hx    Stomach cancer Neg Hx    Esophageal cancer Neg Hx     Social History   Socioeconomic History   Marital status: Married    Spouse name: Not on file   Number of children: 0   Years of education: Not on file   Highest education level: Not on file  Occupational History   Occupation: Dentist   Occupation: dentist  Tobacco Use   Smoking status: Never   Smokeless tobacco: Never  Vaping Use   Vaping status: Never Used  Substance and Sexual Activity   Alcohol use: Yes    Alcohol/week: 8.0 - 10.0 standard drinks of alcohol    Types: 8 - 10 Glasses of wine per week    Comment: 1 drink a day   Drug use: No   Sexual activity: Not Currently    Birth control/protection: Post-menopausal  Other Topics Concern   Not on file  Social History Narrative   Not on file   Social Drivers of Health   Financial Resource Strain: Low Risk  (07/27/2023)   Overall Financial Resource Strain (CARDIA)    Difficulty of Paying Living Expenses: Not hard at all  Food Insecurity: No Food Insecurity (07/29/2023)   Hunger Vital Sign    Worried About Running Out of Food in the Last Year: Never true    Ran Out of Food in the Last Year: Never true  Transportation Needs: No Transportation Needs (07/27/2023)   PRAPARE - Administrator, Civil Service (Medical): No    Lack of Transportation (Non-Medical): No  Physical Activity: Sufficiently Active (07/27/2023)   Exercise Vital Sign    Days of Exercise per Week: 7 days    Minutes of Exercise per Session: 90 min  Stress: No Stress Concern Present (07/27/2023)   [employer] of Occupational Health - Occupational Stress Questionnaire    Feeling of Stress : Only a little  Social Connections: Moderately Integrated (07/27/2023)   Social  Connection and Isolation Panel    Frequency of Communication with Friends and Family: Once a week    Frequency of Social Gatherings with Friends and Family: Three times a week    Attends Religious Services: Never    Active Member of Clubs or Organizations: Yes    Attends Banker Meetings: More than 4 times per year    Marital Status:  Married  Intimate Partner Violence: Not At Risk (07/29/2023)   Humiliation, Afraid, Rape, and Kick questionnaire    Fear of Current or Ex-Partner: No    Emotionally Abused: No    Physically Abused: No    Sexually Abused: No    Review of Systems: All other review of systems negative except as mentioned in the HPI.  Physical Exam: Vital signs BP (!) 142/74   Pulse 66   Temp (!) 97.2 F (36.2 C) (Skin)   Ht 5' 4 (1.626 m)   Wt 91 lb (41.3 kg)   SpO2 100%   BMI 15.62 kg/m   General:   Alert,  Well-developed, pleasant and cooperative in NAD Lungs:  Clear throughout to auscultation.   Heart:  Regular rate and rhythm Abdomen:  Soft, nontender and nondistended.   Neuro/Psych:  Alert and cooperative. Normal mood and affect. A and O x 3  Christi Coward, MD [facility]enter[facility]

## 2024-01-10 NOTE — Progress Notes (Signed)
 Pt's states no medical or surgical changes since previsit or office visit.

## 2024-01-10 NOTE — Progress Notes (Signed)
 Called to room to assist during endoscopic procedure.  Patient ID and intended procedure confirmed with present staff. Received instructions for my participation in the procedure from the performing physician.

## 2024-01-10 NOTE — Patient Instructions (Signed)
 Please read handouts provided. Continue present medications. Await pathology results. Continue Pepcid 20 mg/ daily for at least 30 day trial. Resume previous diet.   YOU HAD AN ENDOSCOPIC PROCEDURE TODAY AT THE [facility]:   Refer to the procedure report that was given to you for any specific questions about what was found during the examination.  If the procedure report does not answer your questions, please call your gastroenterologist to clarify.  If you requested that your care partner not be given the details of your procedure findings, then the procedure report has been included in a sealed envelope for you to review at your convenience later.  YOU SHOULD EXPECT: Some feelings of bloating in the abdomen. Passage of more gas than usual.  Walking can help get rid of the air that was put into your GI tract during the procedure and reduce the bloating. If you had a lower endoscopy (such as a colonoscopy or flexible sigmoidoscopy) you may notice spotting of blood in your stool or on the toilet paper. If you underwent a bowel prep for your procedure, you may not have a normal bowel movement for a few days.  Please Note:  You might notice some irritation and congestion in your nose or some drainage.  This is from the oxygen used during your procedure.  There is no need for concern and it should clear up in a day or so.  SYMPTOMS TO REPORT IMMEDIATELY:  Following lower endoscopy (colonoscopy or flexible sigmoidoscopy):  Excessive amounts of blood in the stool  Significant tenderness or worsening of abdominal pains  Swelling of the abdomen that is new, acute  Fever of 100F or higher  Following upper endoscopy (EGD)  Vomiting of blood or coffee ground material  New chest pain or pain under the shoulder blades  Painful or persistently difficult swallowing  New shortness of breath  Fever of 100F or higher  Black, tarry-looking stools  For urgent or emergent issues, a  gastroenterologist can be reached at any hour by calling [phone]. Do not use MyChart messaging for urgent concerns.    DIET:  We do recommend a small meal at first, but then you may proceed to your regular diet.  Drink plenty of fluids but you should avoid alcoholic beverages for 24 hours.  ACTIVITY:  You should plan to take it easy for the rest of today and you should NOT DRIVE or use heavy machinery until tomorrow (because of the sedation medicines used during the test).    FOLLOW UP: Our staff will call the number listed on your records the next business day following your procedure.  We will call around 7:15- 8:00 am to check on you and address any questions or concerns that you may have regarding the information given to you following your procedure. If we do not reach you, we will leave a message.     If any biopsies were taken you will be contacted by phone or by letter within the next 1-3 weeks.  Please call us  at [phone] if you have not heard about the biopsies in 3 weeks.    SIGNATURES/CONFIDENTIALITY: You and/or your care partner have signed paperwork which will be entered into your electronic medical record.  These signatures attest to the fact that that the information above on your After Visit Summary has been reviewed and is understood.  Full responsibility of the confidentiality of this discharge information lies with you and/or your care-partner.

## 2024-01-10 NOTE — Progress Notes (Signed)
 Vss nad trans to pacu

## 2024-01-10 NOTE — Op Note (Signed)
 [facility] Patient Name: Tammy Shields Procedure Date: 01/10/2024 1:46 PM MRN: [id number] Endoscopist: Landon Pinion P. General Kenner , MD, [id number] Age: 68 Referring MD:  Date of Birth: 1956/06/25 Gender: Female Account #: [id number] Procedure:                Colonoscopy Indications:              Screening for colorectal malignant neoplasm - last                            exam 08/2013 - normal Medicines:                Monitored Anesthesia Care Procedure:                Pre-Anesthesia Assessment:                           - Prior to the procedure, a History and Physical                            was performed, and patient medications and                            allergies were reviewed. [name] tolerance of                            previous anesthesia was also reviewed. The risks                            and benefits of the procedure and the sedation                            options and risks were discussed with the patient.                            All questions were answered, and informed consent                            was obtained. Prior Anticoagulants: The patient has                            taken no anticoagulant or antiplatelet agents. ASA                            Grade Assessment: II - A patient with mild systemic                            disease. After reviewing the risks and benefits,                            the patient was deemed in satisfactory condition to                            undergo the procedure.  After obtaining informed consent, the colonoscope                            was passed under direct vision. Throughout the                            procedure, the patient's blood pressure, pulse, and                            oxygen saturations were monitored continuously. The                            PCF-H190TL Slim SN [id number] was introduced through                            the anus and advanced to the  the cecum, identified                            by appendiceal orifice and ileocecal valve. The                            colonoscopy was technically difficult and complex                            due to a tortuous colon. The patient tolerated the                            procedure well. The quality of the bowel                            preparation was good. The ileocecal valve,                            appendiceal orifice, and rectum were photographed. Scope In: 2:03:48 PM Scope Out: 2:29:21 PM Scope Withdrawal Time: 0 hours 16 minutes 33 seconds  Total Procedure Duration: 0 hours 25 minutes 33 seconds  Findings:                 The perianal and digital rectal examinations were                            normal.                           The colon was quite tortuous. Ultraslim pediatric                            colonoscope used to complete this exam.                           A diminutive polyp was found in the hepatic                            flexure. The polyp was sessile. The polyp was  removed with a cold snare. Resection and retrieval                            were complete.                           A single small-mouthed diverticulum was found in                            the transverse colon.                           Internal hemorrhoids were found during retroflexion.                           The exam was otherwise without abnormality. Complications:            No immediate complications. Estimated blood loss:                            Minimal. Estimated Blood Loss:     Estimated blood loss was minimal. Impression:               - Tortuous colon.                           - One diminutive polyp at the hepatic flexure,                            removed with a cold snare. Resected and retrieved.                           - Diverticulosis in the transverse colon.                           - Internal hemorrhoids.                            - The examination was otherwise normal. Recommendation:           - Patient has a contact number available for                            emergencies. The signs and symptoms of potential                            delayed complications were discussed with the                            patient. Return to normal activities tomorrow.                            Written discharge instructions were provided to the                            patient.                           -  Resume previous diet.                           - Continue present medications.                           - Await pathology results. Landon Pinion P. Ijanae Macapagal, MD 01/10/2024 2:33:56 PM This report has been signed electronically.

## 2024-01-10 NOTE — Op Note (Signed)
 [facility] Patient Name: Tammy Shields Procedure Date: 01/10/2024 1:47 PM MRN: [id number] Endoscopist: Landon Pinion P. General Kenner , MD, [id number] Age: 68 Referring MD:  Date of Birth: 24-Sep-1955 Gender: Female Account #: [id number] Procedure:                Upper GI endoscopy Indications:              Epigastric abdominal pain - pepcid helps somewhat                            but patient does not like to take it routinely,                            wants to avoid PPIs (osteoporosis). Prior RUQ US                             showed no gallstones. Medicines:                Monitored Anesthesia Care Procedure:                Pre-Anesthesia Assessment:                           - Prior to the procedure, a History and Physical                            was performed, and patient medications and                            allergies were reviewed. [name] tolerance of                            previous anesthesia was also reviewed. The risks                            and benefits of the procedure and the sedation                            options and risks were discussed with the patient.                            All questions were answered, and informed consent                            was obtained. Prior Anticoagulants: The patient has                            taken no anticoagulant or antiplatelet agents. ASA                            Grade Assessment: II - A patient with mild systemic                            disease. After reviewing the risks and benefits,  the patient was deemed in satisfactory condition to                            undergo the procedure.                           After obtaining informed consent, the endoscope was                            passed under direct vision. Throughout the                            procedure, the patient's blood pressure, pulse, and                            oxygen saturations were  monitored continuously. The                            GIF [id number] #[id number] was introduced through the                            mouth, and advanced to the second part of duodenum.                            The upper GI endoscopy was accomplished without                            difficulty. The patient tolerated the procedure                            well. Scope In: Scope Out: Findings:                 Esophagogastric landmarks were identified: the                            Z-line was found at 36 cm, the gastroesophageal                            junction was found at 36 cm and the upper extent of                            the gastric folds was found at 38 cm from the                            incisors.                           A 2 cm hiatal hernia was present.                           LA Grade A esophagitis was found 36 cm from the                            incisors.  Areas of ectopic gastric mucosa were found in the                            upper third of the esophagus.                           The exam of the esophagus was otherwise normal.                           Multiple sessile polyps were found in the gastric                            fundus and in the gastric body. They appeared to be                            benign fundic gland polyps. A few representative                            biopsies were taken with a cold forceps for                            histology.                           The exam of the stomach was otherwise normal.                           Biopsies were taken with a cold forceps for                            Helicobacter pylori testing.                           The examined duodenum was normal. Complications:            No immediate complications. Estimated blood loss:                            Minimal. Estimated Blood Loss:     Estimated blood loss was minimal. Impression:               - Esophagogastric  landmarks identified.                           - 2 cm hiatal hernia.                           - LA Grade A reflux esophagitis.                           - Ectopic gastric mucosa in the upper third of the                            esophagus.                           -  Normal esophagus otherwise.                           - Multiple gastric polyps. Suspect benign fundic                            gland polyps. Biopsied.                           - Normal stomach otherwise - biopsies taken to rule                            out H pylori                           - Normal examined duodenum.                           Possible GERD / mild esophagitis is causing                            symptoms. Recommend trial of pepcid daily for 30                            days (she wishes to avoid PPI if possible,                            osteoporosis). Recommendation:           - Patient has a contact number available for                            emergencies. The signs and symptoms of potential                            delayed complications were discussed with the                            patient. Return to normal activities tomorrow.                            Written discharge instructions were provided to the                            patient.                           - Resume previous diet.                           - Continue present medications.                           - Await pathology results.                           - Continue pepcid 20mg  / daily for at least 30 day  trial Lendon Queen. Isaak Delmundo, MD 01/10/2024 2:36:14 PM This report has been signed electronically.

## 2024-01-11 ENCOUNTER — Telehealth: Payer: Self-pay

## 2024-01-11 NOTE — Telephone Encounter (Signed)
  Follow up Call-     01/10/2024   12:33 PM  Call back number  Post procedure Call Back phone  # [phone]  Permission to leave phone message Yes    Post op call attempted, no answer, left WM.

## 2024-01-12 DIAGNOSIS — M542 Cervicalgia: Secondary | ICD-10-CM | POA: Diagnosis not present

## 2024-01-13 LAB — SURGICAL PATHOLOGY

## 2024-01-15 ENCOUNTER — Ambulatory Visit: Payer: Self-pay | Admitting: Gastroenterology

## 2024-01-16 ENCOUNTER — Other Ambulatory Visit: Payer: Self-pay | Admitting: Internal Medicine

## 2024-01-18 ENCOUNTER — Ambulatory Visit

## 2024-01-18 DIAGNOSIS — Z13820 Encounter for screening for osteoporosis: Secondary | ICD-10-CM | POA: Diagnosis not present

## 2024-01-18 DIAGNOSIS — M810 Age-related osteoporosis without current pathological fracture: Secondary | ICD-10-CM

## 2024-01-18 DIAGNOSIS — Z780 Asymptomatic menopausal state: Secondary | ICD-10-CM | POA: Diagnosis not present

## 2024-01-20 ENCOUNTER — Other Ambulatory Visit: Payer: Self-pay | Admitting: Internal Medicine

## 2024-01-24 ENCOUNTER — Ambulatory Visit (HOSPITAL_BASED_OUTPATIENT_CLINIC_OR_DEPARTMENT_OTHER): Admitting: Radiology

## 2024-01-24 DIAGNOSIS — M542 Cervicalgia: Secondary | ICD-10-CM | POA: Diagnosis not present

## 2024-01-25 ENCOUNTER — Other Ambulatory Visit: Payer: Self-pay | Admitting: Internal Medicine

## 2024-02-07 DIAGNOSIS — M542 Cervicalgia: Secondary | ICD-10-CM | POA: Diagnosis not present

## 2024-02-20 DIAGNOSIS — M542 Cervicalgia: Secondary | ICD-10-CM | POA: Diagnosis not present

## 2024-02-29 ENCOUNTER — Other Ambulatory Visit (HOSPITAL_COMMUNITY): Payer: Self-pay

## 2024-02-29 ENCOUNTER — Telehealth: Payer: Self-pay

## 2024-02-29 ENCOUNTER — Ambulatory Visit

## 2024-02-29 VITALS — BP 108/70 | HR 63 | Ht 64.0 in | Wt 91.0 lb

## 2024-02-29 DIAGNOSIS — M818 Other osteoporosis without current pathological fracture: Secondary | ICD-10-CM

## 2024-02-29 MED ORDER — DENOSUMAB 60 MG/ML ~~LOC~~ SOSY
60.0000 mg | PREFILLED_SYRINGE | Freq: Once | SUBCUTANEOUS | Status: AC
  Administered 2024-02-29: 60 mg via SUBCUTANEOUS

## 2024-02-29 MED ORDER — DENOSUMAB 60 MG/ML ~~LOC~~ SOSY: 60.0000 mg | PREFILLED_SYRINGE | SUBCUTANEOUS | Status: AC

## 2024-02-29 NOTE — Telephone Encounter (Signed)
 Pt ready for scheduling for PROLIA  on or after : 02/29/24  Option# 1: Buy/Bill (Office supplied medication)  Out-of-pocket cost due at time of clinic visit: $332  Number of injection/visits approved: ---  Primary: [employer] Prolia  co-insurance: 20% Admin fee co-insurance: 0%  Secondary: --- Prolia  co-insurance:  Admin fee co-insurance:   Medical Benefit Details: Date Benefits were checked: 02/29/24 Deductible: NO/ Coinsurance: 20%/ Admin Fee: 0%  Prior Auth: N/A PA# Expiration Date:   # of doses approved: ----------------------------------------------------------------------- Option# 2- Med Obtained from pharmacy:  Pharmacy benefit: Copay $250 (Paid to pharmacy) Admin Fee: 0% (Pay at clinic)  Prior Auth: N/A PA# Expiration Date:   # of doses approved:   If patient wants fill through the pharmacy benefit please send prescription to: [facility], and include estimated need by date in rx notes. Pharmacy will ship medication directly to the office.  Patient NOT eligible for Prolia  Copay Card. Copay Card can make patient's cost as little as $25. Link to apply: [email or web address]://[email or web address]  ** This summary of benefits is an estimation of the patient's out-of-pocket cost. Exact cost may very based on individual plan coverage.

## 2024-02-29 NOTE — Progress Notes (Signed)
 Patient presents to the clinic for a Prolia  injection for osteoporosis.   Prolia  60mg /mL given by [name], SQ left arm.    RTC in 6 months for another injection.

## 2024-03-05 ENCOUNTER — Ambulatory Visit (INDEPENDENT_AMBULATORY_CARE_PROVIDER_SITE_OTHER)

## 2024-03-05 DIAGNOSIS — T63441D Toxic effect of venom of bees, accidental (unintentional), subsequent encounter: Secondary | ICD-10-CM

## 2024-03-06 DIAGNOSIS — M542 Cervicalgia: Secondary | ICD-10-CM | POA: Diagnosis not present

## 2024-03-12 ENCOUNTER — Telehealth: Payer: Self-pay | Admitting: Internal Medicine

## 2024-03-20 DIAGNOSIS — M542 Cervicalgia: Secondary | ICD-10-CM | POA: Diagnosis not present

## 2024-03-21 ENCOUNTER — Other Ambulatory Visit: Payer: Self-pay

## 2024-03-21 ENCOUNTER — Other Ambulatory Visit: Payer: Self-pay | Admitting: Pharmacy Technician

## 2024-04-17 DIAGNOSIS — M542 Cervicalgia: Secondary | ICD-10-CM | POA: Diagnosis not present

## 2024-05-01 DIAGNOSIS — M542 Cervicalgia: Secondary | ICD-10-CM | POA: Diagnosis not present

## 2024-05-24 DIAGNOSIS — M542 Cervicalgia: Secondary | ICD-10-CM | POA: Diagnosis not present

## 2024-05-30 ENCOUNTER — Ambulatory Visit

## 2024-05-30 DIAGNOSIS — T63441D Toxic effect of venom of bees, accidental (unintentional), subsequent encounter: Secondary | ICD-10-CM

## 2024-06-20 DIAGNOSIS — M542 Cervicalgia: Secondary | ICD-10-CM | POA: Diagnosis not present

## 2024-07-05 ENCOUNTER — Other Ambulatory Visit: Payer: Self-pay | Admitting: Internal Medicine

## 2024-07-09 NOTE — Telephone Encounter (Signed)
 Done

## 2024-07-17 ENCOUNTER — Other Ambulatory Visit: Payer: Self-pay

## 2024-07-17 NOTE — Progress Notes (Signed)
 "  Annual Wellness Visit   Patient Care Team: [name], MD as PCP - General (Internal Medicine)  Visit Date: 07/31/2024   Chief Complaint  Patient presents with   Annual Exam   Medicare Wellness   Subjective:  Patient: Tammy Shields, DDS, Female DOB: [birth year], 68 y.o. MRN: [id number] Vitals:   07/31/24 0946  BP: 120/80   Tammy Shields, DDS is a 68 y.o. Female who presents today for her Annual Wellness Visit. Patient has Neutropenia; Allergic to insect stings; Musculoskeletal pain; Anaphylaxis due to hymenoptera venom; and Osteoporosis on their problem list.  She has a productive cough and congestion. She coughs up white sputum.   Brother died recently of a heart attack and she was agreeable to getting a coronary calcium score to determine her risk of heart disease.   History of Osteoporosis treated with prolia  60 mg injected every 6 months   History of vitamin D  deficiency treated with Vitamin D  1.25 mg weekly.   Labs 07/31/2023 Absolute lymphocytes 827, HgbA1c 5.7%, Otherwise WNL.    PAP Smear 05/29/21 was normal with no repeat recommendation due to aging out.    01/18/2024 Bone density LUMBAR SPINE (L1-L4): BMD (in g/cm2): 0.701 T-score: -4.0  09/29/2021 mammogram No mammographic evidence of malignancy. Repeat in one year.    01/10/2024 Colonoscopy Tortuous colon. One diminutive polyp at the hepatic flexure, removed with a cold snare. Resected and retrieved. Diverticulosis in the transverse colon. Internal hemorrhoids. The examination was otherwise normal. Repeat not recommended.    Vaccines discussed.    Health Maintenance: Mammogram due.   Health Maintenance  Topic Date Due   Pneumococcal Vaccine: 50+ Years (1 of 2 - PCV) Never done   Zoster Vaccines- Shingrix (1 of 2) Never done   Mammogram  09/30/2023   COVID-19 Vaccine (4 - 2025-26 season) 03/26/2024   Medicare Annual Wellness (AWV)  07/28/2024   Influenza Vaccine  10/23/2024 (Originally 02/24/2024)    DTaP/Tdap/Td (3 - Td or Tdap) 05/30/2031   Colonoscopy  01/09/2034   Bone Density Scan  Completed   Hepatitis C Screening  Completed   Meningococcal B Vaccine  Aged Out   Review of Systems  Constitutional:  Negative for fever and malaise/fatigue.  HENT:  Positive for congestion.   Eyes:  Negative for blurred vision.  Respiratory:  Positive for cough. Negative for shortness of breath.   Cardiovascular:  Negative for chest pain, palpitations and leg swelling.  Gastrointestinal:  Negative for vomiting.  Musculoskeletal:  Negative for back pain.  Skin:  Negative for rash.  Neurological:  Negative for loss of consciousness and headaches.   Objective:  Vitals: body mass index is 16.14 kg/m. Today's Vitals   07/31/24 0946  BP: 120/80  Pulse: 77  SpO2: 99%  Weight: 94 lb (42.6 kg)  Height: 5' 4 (1.626 m)   Physical Exam Vitals and nursing note reviewed.  Constitutional:      General: She is not in acute distress.    Appearance: Normal appearance. She is not ill-appearing or toxic-appearing.  HENT:     Head: Normocephalic and atraumatic.     Right Ear: Hearing, tympanic membrane, ear canal and external ear normal.     Left Ear: Hearing, tympanic membrane, ear canal and external ear normal.     Ears:     Comments: Cerumen in right ear.     Mouth/Throat:     Pharynx: Oropharynx is clear.  Eyes:     Extraocular Movements: Extraocular movements  intact.     Pupils: Pupils are equal, round, and reactive to light.  Neck:     Thyroid : No thyroid  mass, thyromegaly or thyroid  tenderness.     Vascular: No carotid bruit.  Cardiovascular:     Rate and Rhythm: Normal rate and regular rhythm. No extrasystoles are present.    Pulses:          Dorsalis pedis pulses are 2+ on the right side and 2+ on the left side.     Heart sounds: Normal heart sounds. No murmur heard.    No friction rub. No gallop.  Pulmonary:     Effort: Pulmonary effort is normal.     Breath sounds: Normal breath  sounds. No decreased breath sounds, wheezing, rhonchi or rales.  Chest:     Chest wall: No mass.  Abdominal:     Palpations: Abdomen is soft. There is no hepatomegaly, splenomegaly or mass.     Tenderness: There is no abdominal tenderness.     Hernia: No hernia is present.  Genitourinary:    Comments: Pelvic exam deferred to GYN. Musculoskeletal:     Cervical back: Normal range of motion.     Right lower leg: No edema.     Left lower leg: No edema.  Lymphadenopathy:     Cervical: No cervical adenopathy.     Upper Body:     Right upper body: No supraclavicular adenopathy.     Left upper body: No supraclavicular adenopathy.  Skin:    General: Skin is warm and dry.  Neurological:     General: No focal deficit present.     Mental Status: She is alert and oriented to person, place, and time. Mental status is at baseline.     Sensory: Sensation is intact.     Motor: Motor function is intact. No weakness.     Deep Tendon Reflexes: Reflexes are normal and symmetric.  Psychiatric:        Attention and Perception: Attention normal.        Mood and Affect: Mood normal.        Speech: Speech normal.        Behavior: Behavior normal.        Thought Content: Thought content normal.        Cognition and Memory: Cognition normal.        Judgment: Judgment normal.     Current Outpatient Medications  Medication Instructions   ascorbic acid (VITAMIN C) 1,000 mg, Daily   azithromycin  (ZITHROMAX ) 250 MG tablet Take 2 tablets on day 1, then 1 tablet daily on days 2 through 5   famotidine (PEPCID) 20 mg, As needed   fluconazole  (DIFLUCAN ) 150 mg, Oral,  Once   ibuprofen (ADVIL) 400 mg, Every 6 hours PRN   magnesium oxide (MAG-OX) 250 mg, Daily   metroNIDAZOLE (METROGEL) 0.75 % gel 1 Application, 2 times daily   Multiple Vitamins-Minerals (CENTRUM SILVER ADULT 50+) TABS 1 tablet, Daily   NEFFY  2 MG/0.1ML SOLN 1 spray, Nasal, Once PRN, Use only in 1 nostril.   NON FORMULARY Ca(Life Extension  Bone Restore with Vit K2)  Take 1 capsule po 4 X daily.   Vitamin D  (Ergocalciferol ) (DRISDOL ) 50,000 Units, Oral, Every 7 days   Vitamin D  1,000 Units, Daily   Past Medical History:  Diagnosis Date   Allergy    Arthritis    Basal cell carcinoma 2008   Bulging of cervical intervertebral disc    Carpal tunnel syndrome on right  Finger numbness    GERD (gastroesophageal reflux disease)    Hymenoptera allergy    Osteoporosis    Postmenopausal    Raynaud's disease    self diagnosis   Medical/Surgical History Narrative:  Allergic/Intolerant to: Allergies[1]  Past Surgical History:  Procedure Laterality Date   BIOPSY ENDOMETRIAL  2006   BREAST CYST ASPIRATION  12/02/1995   Family History  Problem Relation Age of Onset   Heart disease Mother    Diabetes Mother    Lung cancer Mother    Hypertension Mother    Cancer Mother    Anxiety disorder Mother    Arthritis Mother    Varicose Veins Mother    Prostate cancer Father    Cancer Father    Arthritis Father    Early death Father    Hypertension Sister    Hyperlipidemia Sister    ADD / ADHD Sister    Heart disease Sister    Diabetes Brother    Hypertension Brother    Hyperlipidemia Brother    Heart disease Brother    Heart disease Maternal Grandfather    Cancer Maternal Grandmother    Cancer Paternal Grandfather    Heart disease Paternal Grandmother    Colon cancer Neg Hx    Stomach cancer Neg Hx    Esophageal cancer Neg Hx    Social History   Social History Narrative   Not on file   Most Recent Health Risks Assessment:   Most Recent Social Determinants of Health (Including Hx of Tobacco, Alcohol, and Drug Use) SDOH Screenings   Food Insecurity: No Food Insecurity (07/31/2024)  Housing: Low Risk (07/31/2024)  Transportation Needs: Unmet Transportation Needs (07/31/2024)  Utilities: Not At Risk (07/31/2024)  Alcohol Screen: Low Risk (07/26/2024)  Depression (PHQ2-9): Low Risk (07/31/2024)  Financial Resource Strain:  Low Risk (07/26/2024)  Physical Activity: Sufficiently Active (07/31/2024)  Social Connections: Moderately Isolated (07/31/2024)  Stress: No Stress Concern Present (07/31/2024)  Tobacco Use: Low Risk (07/31/2024)  Health Literacy: Adequate Health Literacy (07/31/2024)   Social History[2] Most Recent Functional Status Assessment:    07/30/2024   12:42 PM  In your present state of health, do you have any difficulty performing the following activities:  Hearing? 0   Vision? 0      Manually entered by patient   Most Recent Fall Risk Assessment:    07/31/2024    9:46 AM  Fall Risk   Falls in the past year? 0  Number falls in past yr: 0  Injury with Fall? 0  Risk for fall due to : No Fall Risks  Follow up Falls prevention discussed;Education provided;Falls evaluation completed   Most Recent Anxiety/Depression Screenings:    07/31/2024    9:53 AM 01/02/2024    3:03 PM  PHQ 2/9 Scores  PHQ - 2 Score 0 0  PHQ- 9 Score 2       07/31/2024    9:53 AM  GAD 7 : Generalized Anxiety Score  Nervous, Anxious, on Edge 1  Control/stop worrying 0  Worry too much - different things 1  Trouble relaxing 0  Restless 0  Easily annoyed or irritable 0  Afraid - awful might happen 0  Total GAD 7 Score 2  Anxiety Difficulty Not difficult at all   Most Recent Cognitive Screening:    07/29/2023   11:04 AM  6CIT Screen  What Year? 0 points  What month? 0 points  What time? 0 points  Count back from 20 0 points  Months in reverse 0 points  Repeat phrase 0 points  Total Score 0 points   Most Recent Vision/Hearing Screenings:Vision Screening - Comments:: Patient states she is up to date with her eye exams Done by Dr. Arlena Reusing at [facility].  Results:  Studies Obtained And Personally Reviewed By Me:  PAP Smear 05/29/21 was normal with no repeat recommendation due to aging out.    01/18/2024 Bone density LUMBAR SPINE (L1-L4): BMD (in g/cm2): 0.701 T-score: -4.0  09/29/2021 mammogram No  mammographic evidence of malignancy. Repeat in one year.    01/10/2024 Colonoscopy Tortuous colon. One diminutive polyp at the hepatic flexure, removed with a cold snare. Resected and retrieved. Diverticulosis in the transverse colon. Internal hemorrhoids. The examination was otherwise normal. Repeat not recommended.    Labs:  CBC w/ Differential Lab Results  Component Value Date   WBC 5.7 07/30/2024   RBC 4.39 07/30/2024   HGB 13.9 07/30/2024   HCT 41.8 07/30/2024   PLT 201 07/30/2024   MCV 95.2 07/30/2024   MCH 31.7 07/30/2024   MCHC 33.3 07/30/2024   RDW 12.6 07/30/2024   MPV 10.8 07/30/2024   LYMPHSABS 948 10/29/2022   MONOABS 0.2 03/11/2017   BASOSABS 29 07/30/2024    Comprehensive Metabolic Panel Lab Results  Component Value Date   NA 141 07/30/2024   K 5.3 07/30/2024   CL 104 07/30/2024   CO2 28 07/30/2024   GLUCOSE 91 07/30/2024   BUN 17 07/30/2024   CREATININE 0.61 07/30/2024   CALCIUM 9.3 07/30/2024   PROT 6.8 07/30/2024   ALBUMIN 4.5 05/13/2023   AST 21 07/30/2024   ALT 17 07/30/2024   ALKPHOS 70 03/01/2019   BILITOT 0.4 07/30/2024   GFR 91.94 05/13/2023   EGFR 96 07/30/2024   GFRNONAA 91 06/27/2020   Lipid Panel  Lab Results  Component Value Date   CHOL 194 07/30/2024   HDL 109 07/30/2024   LDLCALC 74 07/30/2024   TRIG 37 07/30/2024   A1c Lab Results  Component Value Date   HGBA1C 5.7 (H) 07/30/2024    TSH Lab Results  Component Value Date   TSH 0.74 07/30/2024    Assessment & Plan:   Orders Placed This Encounter  Procedures   MM 3D SCREENING MAMMOGRAM BILATERAL BREAST    Standing Status:   Future    Expiration Date:   07/31/2025    Reason for Exam (SYMPTOM  OR DIAGNOSIS REQUIRED):   screening for breast cancer    Preferred imaging [facility]   Meds ordered this encounter  Medications   azithromycin  (ZITHROMAX ) 250 MG tablet    Sig: Take 2 tablets on day 1, then 1 tablet daily on days 2 through 5    Dispense:  6  tablet    Refill:  0   fluconazole  (DIFLUCAN ) 150 MG tablet    Sig: Take 1 tablet (150 mg total) by mouth once for 1 dose.    Dispense:  1 tablet    Refill:  0   Acute Lower respiratory infection: She has a productive cough and congestion. She coughs up white sputum.    Azithromycin  250 mg two tablets on day one, one daily on days 2-5 prescribed.   Diflucan  150 mg prescribed.   Brother died recently of a heart attack and she was agreeable to getting a coronary calcium score to determine her risk of heart disease.   Coronary calcium scoring ordered.    Osteoporosis: Currently treated with Prolia  60  mg  every 6 months. Bone density study ordered. Note  Prolia   is being changed to another brand name drug  with same formulation per [facility].  Vitamin D  supplementation: treated with Vitamin D  50,000 units weekly. Vitamin D  level was 51 when checked in May 2025  PAP Smear 05/29/21 was normal with no repeat recommendation due to aging out.    01/18/2024 Bone density LUMBAR SPINE (L1-L4): BMD (in g/cm2): 0.701 T-score: -4.0  09/29/2021 Mammogram No mammographic evidence of malignancy. Repeat in one year.    01/10/2024 Colonoscopy Tortuous colon. One diminutive polyp at the hepatic flexure, removed with a cold snare. Resected and retrieved. Diverticulosis in the transverse colon. Internal hemorrhoids. The examination was otherwise normal. Repeat not recommended.    Vaccines discussed.   Health maintenance: Mammogram last performed in 2023. Patient encouraged to have this study.   Mammogram ordered    Annual Wellness Visit done today including the all of the following: Reviewed patient's Family Medical History Reviewed patient's SDOH and reviewed tobacco, alcohol, and drug use.  Reviewed and updated list of patient's medical providers Assessment of cognitive impairment was done Assessed patient's functional ability Established a written schedule for health screening services Health Risk  Assessent Completed and Reviewed  Discussed health benefits of physical activity, and encouraged her to engage in regular exercise appropriate for her age and condition.   Tammy JINNY Hailstone, MD, have reviewed all documentation for and agree with the above Annual Wellness Visit documentation.  Ronal JINNY Hailstone, MD Internal Medicine 07/31/2024     [1]  Allergies Allergen Reactions   Bee Venom Anaphylaxis    MIXED VESPID, HONEY BEE  [2]  Social History Tobacco Use   Smoking status: Never   Smokeless tobacco: Never  Vaping Use   Vaping status: Never Used  Substance Use Topics   Alcohol use: Yes    Alcohol/week: 14.0 standard drinks of alcohol    Types: 14 Glasses of wine per week    Comment: 1 drink a day   Drug use: Never   "

## 2024-07-18 ENCOUNTER — Other Ambulatory Visit: Payer: Self-pay | Admitting: Internal Medicine

## 2024-07-18 ENCOUNTER — Other Ambulatory Visit: Payer: Self-pay

## 2024-07-20 ENCOUNTER — Other Ambulatory Visit: Payer: Self-pay

## 2024-07-30 ENCOUNTER — Telehealth: Payer: Self-pay | Admitting: Internal Medicine

## 2024-07-30 ENCOUNTER — Other Ambulatory Visit: Payer: PPO

## 2024-07-30 DIAGNOSIS — M818 Other osteoporosis without current pathological fracture: Secondary | ICD-10-CM

## 2024-07-30 DIAGNOSIS — M810 Age-related osteoporosis without current pathological fracture: Secondary | ICD-10-CM

## 2024-07-30 DIAGNOSIS — Z0000 Encounter for general adult medical examination without abnormal findings: Secondary | ICD-10-CM

## 2024-07-31 ENCOUNTER — Ambulatory Visit: Payer: PPO | Admitting: Internal Medicine

## 2024-07-31 ENCOUNTER — Encounter: Payer: Self-pay | Admitting: Internal Medicine

## 2024-07-31 ENCOUNTER — Other Ambulatory Visit: Payer: Self-pay | Admitting: Internal Medicine

## 2024-07-31 ENCOUNTER — Other Ambulatory Visit: Payer: Self-pay

## 2024-07-31 VITALS — BP 120/80 | HR 77 | Ht 64.0 in | Wt 94.0 lb

## 2024-07-31 DIAGNOSIS — Z1231 Encounter for screening mammogram for malignant neoplasm of breast: Secondary | ICD-10-CM

## 2024-07-31 DIAGNOSIS — Z0000 Encounter for general adult medical examination without abnormal findings: Secondary | ICD-10-CM | POA: Diagnosis not present

## 2024-07-31 DIAGNOSIS — J22 Unspecified acute lower respiratory infection: Secondary | ICD-10-CM

## 2024-07-31 DIAGNOSIS — Z8249 Family history of ischemic heart disease and other diseases of the circulatory system: Secondary | ICD-10-CM

## 2024-07-31 DIAGNOSIS — M818 Other osteoporosis without current pathological fracture: Secondary | ICD-10-CM | POA: Diagnosis not present

## 2024-07-31 LAB — CBC WITH DIFFERENTIAL/PLATELET
Absolute Lymphocytes: 827 {cells}/uL — ABNORMAL LOW (ref 850–3900)
Absolute Monocytes: 450 {cells}/uL (ref 200–950)
Basophils Absolute: 29 {cells}/uL (ref 0–200)
Basophils Relative: 0.5 %
Eosinophils Absolute: 143 {cells}/uL (ref 15–500)
Eosinophils Relative: 2.5 %
HCT: 41.8 % (ref 35.9–46.0)
Hemoglobin: 13.9 g/dL (ref 11.7–15.5)
MCH: 31.7 pg (ref 27.0–33.0)
MCHC: 33.3 g/dL (ref 31.6–35.4)
MCV: 95.2 fL (ref 81.4–101.7)
MPV: 10.8 fL (ref 7.5–12.5)
Monocytes Relative: 7.9 %
Neutro Abs: 4252 {cells}/uL (ref 1500–7800)
Neutrophils Relative %: 74.6 %
Platelets: 201 Thousand/uL (ref 140–400)
RBC: 4.39 Million/uL (ref 3.80–5.10)
RDW: 12.6 % (ref 11.0–15.0)
Total Lymphocyte: 14.5 %
WBC: 5.7 Thousand/uL (ref 3.8–10.8)

## 2024-07-31 LAB — COMPREHENSIVE METABOLIC PANEL WITH GFR
AG Ratio: 1.8 (calc) (ref 1.0–2.5)
ALT: 17 U/L (ref 6–29)
AST: 21 U/L (ref 10–35)
Albumin: 4.4 g/dL (ref 3.6–5.1)
Alkaline phosphatase (APISO): 48 U/L (ref 37–153)
BUN: 17 mg/dL (ref 7–25)
CO2: 28 mmol/L (ref 20–32)
Calcium: 9.3 mg/dL (ref 8.6–10.4)
Chloride: 104 mmol/L (ref 98–110)
Creat: 0.61 mg/dL (ref 0.60–1.00)
Globulin: 2.4 g/dL (ref 1.9–3.7)
Glucose, Bld: 91 mg/dL (ref 65–99)
Potassium: 5.3 mmol/L (ref 3.5–5.3)
Sodium: 141 mmol/L (ref 135–146)
Total Bilirubin: 0.4 mg/dL (ref 0.2–1.2)
Total Protein: 6.8 g/dL (ref 6.1–8.1)
eGFR: 96 mL/min/1.73m2

## 2024-07-31 LAB — POCT URINALYSIS DIP (CLINITEK)
Bilirubin, UA: NEGATIVE
Blood, UA: NEGATIVE
Glucose, UA: NEGATIVE mg/dL
Ketones, POC UA: NEGATIVE mg/dL
Leukocytes, UA: NEGATIVE
Nitrite, UA: NEGATIVE
POC PROTEIN,UA: NEGATIVE
Spec Grav, UA: 1.01
Urobilinogen, UA: 0.2 U/dL
pH, UA: 6.5

## 2024-07-31 LAB — LIPID PANEL
Cholesterol: 194 mg/dL
HDL: 109 mg/dL
LDL Cholesterol (Calc): 74 mg/dL
Non-HDL Cholesterol (Calc): 85 mg/dL
Total CHOL/HDL Ratio: 1.8 (calc)
Triglycerides: 37 mg/dL

## 2024-07-31 LAB — HEMOGLOBIN A1C
Hgb A1c MFr Bld: 5.7 % — ABNORMAL HIGH
Mean Plasma Glucose: 117 mg/dL
eAG (mmol/L): 6.5 mmol/L

## 2024-07-31 LAB — TSH: TSH: 0.74 m[IU]/L (ref 0.40–4.50)

## 2024-07-31 MED ORDER — FLUCONAZOLE 150 MG PO TABS: 150.0000 mg | ORAL_TABLET | Freq: Once | ORAL | 0 refills | Status: AC

## 2024-07-31 MED ORDER — AZITHROMYCIN 250 MG PO TABS: ORAL_TABLET | ORAL | 0 refills | Status: AC

## 2024-07-31 NOTE — Patient Instructions (Addendum)
 Dr. Nadene,  Coronary calcium  score ordered to check for presence of heart disease. Please call the number we gave you to set this up. Mammogram ordered. Please call [facility] for appt. We will continue with injection for osteoporosis every 6 months. It is likely [facility] will switch product from Prolia  to another brand but same generic formula.   Thank you for taking the time for your Medicare Wellness Visit. I appreciate your continued commitment to your health goals. Please review the care plan we discussed, and feel free to reach out if I can assist you further.  Please note that Annual Wellness Visits do not include a physical exam. Some assessments may be limited, especially if the visit was conducted virtually. If needed, we may recommend an in-person follow-up with your provider.           Coronary calcium  score ordered. We are sorry to hear about your brother.  Addendum: Jan 19th: Patient would like Cardiology referral.                       Ongoing Care Seeing your primary care provider every 3 to 6 months helps us  monitor your health and provide consistent, personalized care.   Referrals If a referral was made during today's visit and you haven't received any updates within two weeks, please contact the referred provider directly to check on the status.  Recommended Screenings:  Health Maintenance  Topic Date Due   Pneumococcal Vaccine for age over 36 (1 of 2 - PCV) Never done   Zoster (Shingles) Vaccine (1 of 2) Never done   Breast Cancer Screening  09/30/2023   COVID-19 Vaccine (4 - 2025-26 season) 03/26/2024   Medicare Annual Wellness Visit  07/28/2024   Flu Shot  10/23/2024*   DTaP/Tdap/Td vaccine (3 - Td or Tdap) 05/30/2031   Colon Cancer Screening  01/09/2034   Osteoporosis screening with Bone Density Scan  Completed   Hepatitis C Screening  Completed   Meningitis B Vaccine  Aged Out  *Topic was postponed. The date shown is not  the original due date.       07/31/2024    9:46 AM  Advanced Directives  Does Patient Have a Medical Advance Directive? Yes  Type of Estate Agent of [place];Living will  Does patient want to make changes to medical advance directive? No - Patient declined  Copy of Healthcare Power of Attorney in Chart? Yes - validated most recent copy scanned in chart (See row information)    Vision: Annual vision screenings are recommended for early detection of glaucoma, cataracts, and diabetic retinopathy. These exams can also reveal signs of chronic conditions such as diabetes and high blood pressure.  Dental: Annual dental screenings help detect early signs of oral cancer, gum disease, and other conditions linked to overall health, including heart disease and diabetes.  Please see the attached documents for additional preventive care recommendations.

## 2024-07-31 NOTE — Telephone Encounter (Signed)
 Done

## 2024-08-02 ENCOUNTER — Other Ambulatory Visit: Payer: Self-pay

## 2024-08-03 ENCOUNTER — Other Ambulatory Visit: Payer: Self-pay

## 2024-08-07 ENCOUNTER — Encounter: Payer: Self-pay | Admitting: Internal Medicine

## 2024-08-08 ENCOUNTER — Other Ambulatory Visit: Payer: Self-pay

## 2024-08-09 ENCOUNTER — Ambulatory Visit (HOSPITAL_BASED_OUTPATIENT_CLINIC_OR_DEPARTMENT_OTHER)
Admission: RE | Admit: 2024-08-09 | Discharge: 2024-08-09 | Disposition: A | Discharge status: Home / Self Care | Payer: Self-pay | Source: Ambulatory Visit | Attending: Internal Medicine | Admitting: Internal Medicine

## 2024-08-09 ENCOUNTER — Other Ambulatory Visit

## 2024-08-09 DIAGNOSIS — Z8249 Family history of ischemic heart disease and other diseases of the circulatory system: Secondary | ICD-10-CM | POA: Insufficient documentation

## 2024-08-10 ENCOUNTER — Ambulatory Visit: Payer: Self-pay | Admitting: Internal Medicine

## 2024-08-10 ENCOUNTER — Other Ambulatory Visit: Payer: Self-pay

## 2024-08-10 DIAGNOSIS — I700 Atherosclerosis of aorta: Secondary | ICD-10-CM

## 2024-08-13 ENCOUNTER — Telehealth: Payer: Self-pay

## 2024-08-13 ENCOUNTER — Other Ambulatory Visit: Payer: Self-pay

## 2024-08-13 ENCOUNTER — Encounter: Payer: Self-pay | Admitting: Internal Medicine

## 2024-08-13 NOTE — Telephone Encounter (Signed)
 Copied from CRM [id number]. Topic: Clinical - Prescription Issue >> Aug 13, 2024 11:47 AM Tammy Shields wrote: Reason for CRM: Pt called requesting to discuss her prolea injection, has concerns because her pharmacy has requested fills. Wants to speak to the clinic today. Please advise.   Best contact: [phone]

## 2024-08-13 NOTE — Addendum Note (Signed)
 Addended by: [name] on: 08/13/2024 02:45 PM   Modules accepted: Orders

## 2024-08-14 ENCOUNTER — Encounter: Payer: Self-pay | Admitting: Cardiology

## 2024-08-14 DIAGNOSIS — I700 Atherosclerosis of aorta: Secondary | ICD-10-CM | POA: Insufficient documentation

## 2024-08-14 NOTE — Progress Notes (Unsigned)
 "    Cardiology Office Note   Date:  08/16/2024   ID:  Tammy Shields, DDS, DOB November 19, 1955, MRN [id number]  PCP:  Tammy Ronal PARAS, MD  Cardiologist:   None Referring:  Tammy Ronal PARAS, MD  Chief Complaint  Patient presents with   Elevated Coronary Calcium       History of Present Illness: Tammy Shields, DDS is a 69 y.o. female who presents for evaluation of aortic atherosclerosis and elevated coronary calcium .  She has no history of heart disease although she does have a family history with her brother recently dying of acute coronary thrombosis although he was in his 79s.  She has not had any other cardiac testing.  She has had abdominal problems and bone health issues.  She is physically active.  She does exercise to include walking, weights, dance classes, she gets her heart rate up with this.  The patient denies any new symptoms such as chest discomfort, neck or arm discomfort. There has been no new shortness of breath, PND or orthopnea. There have been no reported palpitations, presyncope or syncope.   She has some leg tiredness occasionally.   Past Medical History:  Diagnosis Date   Allergy    Arthritis    Basal cell carcinoma 2008   Bulging of cervical intervertebral disc    Carpal tunnel syndrome on right    GERD (gastroesophageal reflux disease)    Hymenoptera allergy    Osteoporosis    Raynaud's disease    self diagnosis    Past Surgical History:  Procedure Laterality Date   BIOPSY ENDOMETRIAL  2006   BREAST CYST ASPIRATION  12/02/1995     Current Outpatient Medications  Medication Sig Dispense Refill   Cholecalciferol (VITAMIN D ) 1000 UNITS capsule Take 1,000 Units by mouth daily.     famotidine (PEPCID) 20 MG tablet Take 20 mg by mouth as needed for heartburn or indigestion.     ibuprofen (ADVIL,MOTRIN) 200 MG tablet Take 400 mg by mouth every 6 (six) hours as needed for mild pain (pain score 1-3). Hs prn     magnesium oxide (MAG-OX) 400 MG tablet Take 250 mg by  mouth daily.     metroNIDAZOLE (METROGEL) 0.75 % gel Apply 1 Application topically 2 (two) times daily.     Multiple Vitamins-Minerals (CENTRUM SILVER ADULT 50+) TABS Take 1 tablet by mouth daily. Takes every other day     NEFFY  2 MG/0.1ML SOLN Place 1 spray into the nose once as needed for up to 1 dose. Use only in 1 nostril. 4 each 4   NON FORMULARY Ca(Life Extension Bone Restore with Vit K2)  Take 1 capsule po 4 X daily.     rosuvastatin  (CRESTOR ) 10 MG tablet Take 1 tablet (10 mg total) by mouth daily. 90 tablet 3   vitamin C (ASCORBIC ACID) 500 MG tablet Take 1,000 mg by mouth daily.      Vitamin D , Ergocalciferol , (DRISDOL ) 1.25 MG (50000 UNIT) CAPS capsule TAKE 1 CAPSULE (50,000 UNITS TOTAL) BY MOUTH EVERY 7 (SEVEN) DAYS 12 capsule 1   Current Facility-Administered Medications  Medication Dose Route Frequency Provider Last Rate Last Admin   [START ON 08/27/2024] denosumab  (PROLIA ) injection 60 mg  60 mg Subcutaneous Q6 months Baxley, Ronal PARAS, MD        Allergies:   Bee venom and Other    Social History:  The patient  reports that she has never smoked. She has never used smokeless tobacco. She reports  current alcohol use of about 14.0 standard drinks of alcohol per week. She reports that she does not use drugs.   Family History:  [name] family history includes ADD / ADHD in her sister; Anxiety disorder in her mother; Arthritis in her father and mother; Cancer in her father, maternal grandmother, mother, and paternal grandfather; Diabetes in her brother and mother; Early death in her father; Heart disease in her brother, maternal grandfather, mother, paternal grandmother, and sister; Hyperlipidemia in her brother and sister; Hypertension in her brother, mother, and sister; Lung cancer in her mother; Prostate cancer in her father; Varicose Veins in her mother.    ROS:  Please see the history of present illness.   Otherwise, review of systems are positive for chronic abdominal  complaints.   All other systems are reviewed and negative.    PHYSICAL EXAM: VS:  BP 112/74   Pulse 77   Ht 5' 4 (1.626 m)   Wt 93 lb 12.8 oz (42.5 kg)   SpO2 98%   BMI 16.10 kg/m  , BMI Body mass index is 16.1 kg/m. GENERAL:  Well appearing HEENT:  Pupils equal round and reactive, fundi not visualized, oral mucosa unremarkable NECK:  No jugular venous distention, waveform within normal limits, carotid upstroke brisk and symmetric, no bruits, no thyromegaly LYMPHATICS:  No cervical, inguinal adenopathy LUNGS:  Clear to auscultation bilaterally BACK:  No CVA tenderness CHEST:  Unremarkable HEART:  PMI not displaced or sustained,S1 and S2 within normal limits, no S3, no S4, no clicks, no rubs, no murmurs ABD:  Flat, positive bowel sounds normal in frequency in pitch, no bruits, no rebound, no guarding, no midline pulsatile mass, no hepatomegaly, no splenomegaly EXT:  2 plus pulses throughout, no edema, no cyanosis no clubbing SKIN:  No rashes no nodules NEURO:  Cranial nerves II through XII grossly intact, motor grossly intact throughout [facility]:  Cognitively intact, oriented to person place and time    EKG:  EKG Interpretation Date/Time:  Thursday August 16 2024 08:15:01 EST Ventricular Rate:  77 PR Interval:  166 QRS Duration:  56 QT Interval:  354 QTC Calculation: 400 R Axis:   52  Text Interpretation: Sinus rhythm with Fusion complexes No previous ECGs available Confirmed by Lavona Rattan ([id number]) on 08/16/2024 8:20:14 AM     Recent Labs: 07/30/2024: ALT 17; BUN 17; Creat 0.61; Hemoglobin 13.9; Platelets 201; Potassium 5.3; Sodium 141; TSH 0.74    Lipid Panel    Component Value Date/Time   CHOL 194 07/30/2024 0938   CHOL 219 (H) 03/01/2019 0917   TRIG 37 07/30/2024 0938   HDL 109 07/30/2024 0938   HDL 105 03/01/2019 0917   CHOLHDL 1.8 07/30/2024 0938   VLDL 11 02/06/2016 1108   LDLCALC 74 07/30/2024 0938      Wt Readings from Last 3 Encounters:  08/16/24 93  lb 12.8 oz (42.5 kg)  07/31/24 94 lb (42.6 kg)  02/29/24 91 lb (41.3 kg)      Other studies Reviewed: Additional studies/ records that were reviewed today include: Labs. Review of the above records demonstrates:  Please see elsewhere in the note.     ASSESSMENT AND PLAN:   Aortic atherosclerosis: We will pursue aggressive risk reduction  Coronary artery disease: The patient does have some elevated coronary calcium .  We talked a lot about primary risk reduction.  I like to bring her back for a POET (Plain Old Exercise Treadmill).  Today I will check an LP(a), ApoB, high-sensitivity C-reactive  protein.  I would manage her very aggressively as discussed below.  She consents to this.  Risk reduction: Her LDL was 74.  The HDL was 96.  I think the goal LDL should be in the 50s although this is an aggressive approach.  I would start with 10 mg of Crestor  and repeat an NMR in 3 months.   Current medicines are reviewed at length with the patient today.  The patient does not have concerns regarding medicines.  The following changes have been made:  no change  Labs/ tests ordered today include:   Orders Placed This Encounter  Procedures   Lipoprotein A (LPA)   NMR, lipoprofile   Apolipoprotein B   CRP High sensitivity   Cardiac Stress Test: Informed Consent Details: Physician/Practitioner Attestation; Transcribe to consent form and obtain patient signature   EXERCISE TOLERANCE TEST (ETT)   EKG 12-Lead     Disposition:   FU with me in one year.      Signed, Lynwood Schilling, MD  08/16/2024 9:09 AM    [facility]    "

## 2024-08-14 NOTE — Telephone Encounter (Signed)
 Tried calling pt but was unable to leave message.  We have to wait until authorization is back from her insurance.  Next injection due on or after 09/01/24.  Authorization should hopefully be back this week.

## 2024-08-15 ENCOUNTER — Other Ambulatory Visit: Payer: Self-pay

## 2024-08-16 ENCOUNTER — Encounter: Payer: Self-pay | Admitting: Cardiology

## 2024-08-16 ENCOUNTER — Ambulatory Visit: Attending: Cardiology | Admitting: Cardiology

## 2024-08-16 VITALS — BP 112/74 | HR 77 | Ht 64.0 in | Wt 93.8 lb

## 2024-08-16 DIAGNOSIS — I700 Atherosclerosis of aorta: Secondary | ICD-10-CM

## 2024-08-16 DIAGNOSIS — I2510 Atherosclerotic heart disease of native coronary artery without angina pectoris: Secondary | ICD-10-CM

## 2024-08-16 MED ORDER — ROSUVASTATIN CALCIUM 10 MG PO TABS: 10.0000 mg | ORAL_TABLET | Freq: Every day | ORAL | 3 refills | Status: AC

## 2024-08-16 NOTE — Patient Instructions (Signed)
 Medication Instructions:  Start Crestor  10 mg once daily *If you need a refill on your cardiac medications before your next appointment, please call your pharmacy*  Lab Work: Lpa, HS CRP, ApoB today at [facility] Fasting NMR in 3 months at [facility] If you have labs (blood work) drawn today and your tests are completely normal, you will receive your results only by: MyChart Message (if you have MyChart) OR A paper copy in the mail If you have any lab test that is abnormal or we need to change your treatment, we will call you to review the results.  Testing/Procedures: Exercise Tolerance Test *No food or drink for 4 hours prior to test *No caffeine, decaf or chocolate for 12 hours prior to test *Wear comfortable clothing and close toed shoes  Follow-Up: At [facility], you and your health needs are our priority.  As part of our continuing mission to provide you with exceptional heart care, our providers are all part of one team.  This team includes your primary Cardiologist (physician) and Advanced Practice Providers or APPs (Physician Assistants and Nurse Practitioners) who all work together to provide you with the care you need, when you need it.  Your next appointment:   1 year(s)  Provider:   Lavona, MD  We recommend signing up for the patient portal called MyChart.  Sign up information is provided on this After Visit Summary.  MyChart is used to connect with patients for Virtual Visits (Telemedicine).  Patients are able to view lab/test results, encounter notes, upcoming appointments, etc.  Non-urgent messages can be sent to your provider as well.   To learn more about what you can do with MyChart, go to [email or web address].

## 2024-08-17 ENCOUNTER — Encounter: Payer: Self-pay | Admitting: Cardiology

## 2024-08-17 ENCOUNTER — Encounter: Payer: Self-pay | Admitting: Internal Medicine

## 2024-08-18 LAB — HIGH SENSITIVITY CRP: CRP, High Sensitivity: 0.18 mg/L (ref 0.00–3.00)

## 2024-08-18 LAB — LIPOPROTEIN A (LPA): Lipoprotein (a): 103.5 nmol/L — ABNORMAL HIGH

## 2024-08-18 LAB — APOLIPOPROTEIN B: Apolipoprotein B: 71 mg/dL

## 2024-08-19 ENCOUNTER — Ambulatory Visit: Payer: Self-pay | Admitting: Cardiology

## 2024-08-20 ENCOUNTER — Telehealth: Payer: Self-pay

## 2024-08-20 NOTE — Telephone Encounter (Signed)
 Prolia  VOB initiated via [email or web address]  Next Prolia  inj DUE: 08/31/24

## 2024-08-21 ENCOUNTER — Other Ambulatory Visit (HOSPITAL_COMMUNITY): Payer: Self-pay

## 2024-08-21 NOTE — Telephone Encounter (Signed)
 Pt ready for scheduling for PROLIA  on or after : 08/31/24  Option# 1: Buy/Bill (Office supplied medication)  Out-of-pocket cost due at time of clinic visit: $352  Number of injection/visits approved: ---  Primary: [employer] Prolia  co-insurance: 20% Admin fee co-insurance: 0%  Secondary: --- Prolia  co-insurance:  Admin fee co-insurance:   Medical Benefit Details: Date Benefits were checked: 08/21/24 Deductible: NO/ Coinsurance: 20%/ Admin Fee: 0%  Prior Auth: N/A PA# Expiration Date:   # of doses approved: ----------------------------------------------------------------------- Option# 2- Med Obtained from pharmacy: [facility] PREFERRED FOR PHARMACY BENEFIT  Pharmacy benefit: Copay $655.20 (Paid to pharmacy) Admin Fee: 0% (Pay at clinic)  Prior Auth: N/A PA# Expiration Date:   # of doses approved:   If patient wants fill through the pharmacy benefit please send prescription to: [facility], and include estimated need by date in rx notes. Pharmacy will ship medication directly to the office.  Patient NOT eligible for Prolia  Copay Card. Copay Card can make patient's cost as little as $25. Link to apply: https://www.amgensupportplus.com/copay  ** This summary of benefits is an estimation of the patient's out-of-pocket cost. Exact cost may very based on individual plan coverage.

## 2024-08-22 ENCOUNTER — Ambulatory Visit

## 2024-08-22 DIAGNOSIS — T63441D Toxic effect of venom of bees, accidental (unintentional), subsequent encounter: Secondary | ICD-10-CM | POA: Diagnosis not present

## 2024-08-23 ENCOUNTER — Telehealth: Payer: Self-pay | Admitting: *Deleted

## 2024-08-23 ENCOUNTER — Telehealth (HOSPITAL_COMMUNITY): Payer: Self-pay

## 2024-08-23 NOTE — Telephone Encounter (Signed)
 Patient will be doing buy and bill from office.  Please order Prolia  for her please.

## 2024-08-23 NOTE — Telephone Encounter (Signed)
 Left message to call back to schedule Prolia /[facility].  She can be scheduled on or after 09/01/24.  Listed below is the cost:  Option# 1: Buy/Bill (Office supplied medication)   Out-of-pocket cost due at time of clinic visit: $352  Or  Option# 2- Med Obtained from pharmacy: [facility] PREFERRED FOR PHARMACY BENEFIT   Pharmacy benefit: Copay $655.20 (Paid to pharmacy) Admin Fee: 0% (Pay at clinic)   If pt calls back please let me know what she would like to do.

## 2024-08-23 NOTE — Telephone Encounter (Signed)
 Detailed instructions left on the patient's answering machine. Tammy Shields CCT

## 2024-08-23 NOTE — Telephone Encounter (Signed)
 Copied from CRM #[id number]. Topic: General - Other >> Aug 23, 2024 12:32 PM Taleah C wrote: Reason for CRM: pt called to return call to sheketia regarding scheduling for her Prolia  Injection. Please call and advise.

## 2024-08-28 ENCOUNTER — Other Ambulatory Visit: Payer: Self-pay

## 2024-08-28 ENCOUNTER — Ambulatory Visit (HOSPITAL_COMMUNITY)
Admission: RE | Admit: 2024-08-28 | Discharge: 2024-08-28 | Disposition: A | Source: Ambulatory Visit | Attending: Cardiology | Admitting: Cardiology

## 2024-08-28 ENCOUNTER — Other Ambulatory Visit (HOSPITAL_COMMUNITY): Payer: Self-pay

## 2024-08-28 DIAGNOSIS — I2510 Atherosclerotic heart disease of native coronary artery without angina pectoris: Secondary | ICD-10-CM

## 2024-08-28 LAB — EXERCISE TOLERANCE TEST
Angina Index: 0
Base ST Depression (mm): 0 mm
Duke Treadmill Score: 4
Exercise duration (min): 14 min
Exercise duration (sec): 0 s
MPHR: 150 {beats}/min
Peak HR: 164 {beats}/min
Percent HR: 109 %
RPE: 18
Rest HR: 68 {beats}/min
ST Depression (mm): 2 mm

## 2024-09-03 ENCOUNTER — Ambulatory Visit

## 2024-10-02 ENCOUNTER — Ambulatory Visit: Admitting: Allergy and Immunology

## 2024-11-14 ENCOUNTER — Ambulatory Visit

## 2024-11-28 ENCOUNTER — Ambulatory Visit: Admitting: Internal Medicine

## 2025-01-02 ENCOUNTER — Ambulatory Visit (HOSPITAL_BASED_OUTPATIENT_CLINIC_OR_DEPARTMENT_OTHER): Admitting: Obstetrics & Gynecology

## 2025-08-08 ENCOUNTER — Other Ambulatory Visit

## 2025-08-09 ENCOUNTER — Ambulatory Visit: Admitting: Internal Medicine
